# Patient Record
Sex: Female | Born: 1940 | Race: White | Hispanic: No | Smoking: Never smoker
Health system: Southern US, Community
[De-identification: ages and names within clinical notes are randomized; demographics above are authoritative.]

## PROBLEM LIST (undated history)

## (undated) DIAGNOSIS — I2510 Atherosclerotic heart disease of native coronary artery without angina pectoris: Secondary | ICD-10-CM

## (undated) DIAGNOSIS — I10 Essential (primary) hypertension: Secondary | ICD-10-CM

## (undated) DIAGNOSIS — I639 Cerebral infarction, unspecified: Secondary | ICD-10-CM

## (undated) DIAGNOSIS — E119 Type 2 diabetes mellitus without complications: Secondary | ICD-10-CM

## (undated) DIAGNOSIS — I38 Endocarditis, valve unspecified: Secondary | ICD-10-CM

## (undated) DIAGNOSIS — I509 Heart failure, unspecified: Secondary | ICD-10-CM

## (undated) DIAGNOSIS — Z950 Presence of cardiac pacemaker: Secondary | ICD-10-CM

## (undated) HISTORY — PX: PACEMAKER INSERTION: SHX728

## (undated) HISTORY — PX: ABDOMINAL HYSTERECTOMY: SHX81

## (undated) HISTORY — PX: OTHER SURGICAL HISTORY: SHX169

---

## 2015-01-27 ENCOUNTER — Inpatient Hospital Stay (HOSPITAL_BASED_OUTPATIENT_CLINIC_OR_DEPARTMENT_OTHER)
Admission: EM | Admit: 2015-01-27 | Discharge: 2015-01-31 | DRG: 378 | Disposition: A | Discharge status: Home / Self Care | Payer: Medicare Other | Attending: Internal Medicine | Admitting: Internal Medicine

## 2015-01-27 ENCOUNTER — Encounter (HOSPITAL_BASED_OUTPATIENT_CLINIC_OR_DEPARTMENT_OTHER): Payer: Self-pay | Admitting: *Deleted

## 2015-01-27 DIAGNOSIS — K449 Diaphragmatic hernia without obstruction or gangrene: Secondary | ICD-10-CM | POA: Diagnosis present

## 2015-01-27 DIAGNOSIS — N179 Acute kidney failure, unspecified: Secondary | ICD-10-CM | POA: Diagnosis present

## 2015-01-27 DIAGNOSIS — K254 Chronic or unspecified gastric ulcer with hemorrhage: Secondary | ICD-10-CM | POA: Diagnosis present

## 2015-01-27 DIAGNOSIS — K259 Gastric ulcer, unspecified as acute or chronic, without hemorrhage or perforation: Secondary | ICD-10-CM | POA: Diagnosis present

## 2015-01-27 DIAGNOSIS — R000 Tachycardia, unspecified: Secondary | ICD-10-CM | POA: Diagnosis not present

## 2015-01-27 DIAGNOSIS — K219 Gastro-esophageal reflux disease without esophagitis: Secondary | ICD-10-CM | POA: Diagnosis present

## 2015-01-27 DIAGNOSIS — E1122 Type 2 diabetes mellitus with diabetic chronic kidney disease: Secondary | ICD-10-CM | POA: Diagnosis present

## 2015-01-27 DIAGNOSIS — Z6837 Body mass index (BMI) 37.0-37.9, adult: Secondary | ICD-10-CM | POA: Diagnosis not present

## 2015-01-27 DIAGNOSIS — M069 Rheumatoid arthritis, unspecified: Secondary | ICD-10-CM | POA: Diagnosis present

## 2015-01-27 DIAGNOSIS — I252 Old myocardial infarction: Secondary | ICD-10-CM

## 2015-01-27 DIAGNOSIS — D62 Acute posthemorrhagic anemia: Secondary | ICD-10-CM | POA: Diagnosis present

## 2015-01-27 DIAGNOSIS — Z7982 Long term (current) use of aspirin: Secondary | ICD-10-CM | POA: Diagnosis not present

## 2015-01-27 DIAGNOSIS — E860 Dehydration: Secondary | ICD-10-CM | POA: Diagnosis present

## 2015-01-27 DIAGNOSIS — E785 Hyperlipidemia, unspecified: Secondary | ICD-10-CM | POA: Diagnosis present

## 2015-01-27 DIAGNOSIS — I2510 Atherosclerotic heart disease of native coronary artery without angina pectoris: Secondary | ICD-10-CM | POA: Diagnosis present

## 2015-01-27 DIAGNOSIS — I5030 Unspecified diastolic (congestive) heart failure: Secondary | ICD-10-CM | POA: Diagnosis present

## 2015-01-27 DIAGNOSIS — Z950 Presence of cardiac pacemaker: Secondary | ICD-10-CM | POA: Diagnosis not present

## 2015-01-27 DIAGNOSIS — I10 Essential (primary) hypertension: Secondary | ICD-10-CM | POA: Diagnosis not present

## 2015-01-27 DIAGNOSIS — I482 Chronic atrial fibrillation: Secondary | ICD-10-CM | POA: Diagnosis present

## 2015-01-27 DIAGNOSIS — Z8711 Personal history of peptic ulcer disease: Secondary | ICD-10-CM

## 2015-01-27 DIAGNOSIS — Z79899 Other long term (current) drug therapy: Secondary | ICD-10-CM

## 2015-01-27 DIAGNOSIS — I130 Hypertensive heart and chronic kidney disease with heart failure and stage 1 through stage 4 chronic kidney disease, or unspecified chronic kidney disease: Secondary | ICD-10-CM | POA: Diagnosis present

## 2015-01-27 DIAGNOSIS — Z955 Presence of coronary angioplasty implant and graft: Secondary | ICD-10-CM | POA: Diagnosis not present

## 2015-01-27 DIAGNOSIS — K921 Melena: Secondary | ICD-10-CM | POA: Diagnosis present

## 2015-01-27 DIAGNOSIS — I69351 Hemiplegia and hemiparesis following cerebral infarction affecting right dominant side: Secondary | ICD-10-CM | POA: Diagnosis not present

## 2015-01-27 DIAGNOSIS — K625 Hemorrhage of anus and rectum: Secondary | ICD-10-CM | POA: Insufficient documentation

## 2015-01-27 DIAGNOSIS — Z7902 Long term (current) use of antithrombotics/antiplatelets: Secondary | ICD-10-CM

## 2015-01-27 DIAGNOSIS — Z90710 Acquired absence of both cervix and uterus: Secondary | ICD-10-CM

## 2015-01-27 DIAGNOSIS — K922 Gastrointestinal hemorrhage, unspecified: Secondary | ICD-10-CM | POA: Diagnosis not present

## 2015-01-27 DIAGNOSIS — M7989 Other specified soft tissue disorders: Secondary | ICD-10-CM | POA: Diagnosis present

## 2015-01-27 DIAGNOSIS — N183 Chronic kidney disease, stage 3 (moderate): Secondary | ICD-10-CM | POA: Diagnosis present

## 2015-01-27 HISTORY — DX: Presence of cardiac pacemaker: Z95.0

## 2015-01-27 HISTORY — DX: Endocarditis, valve unspecified: I38

## 2015-01-27 HISTORY — DX: Atherosclerotic heart disease of native coronary artery without angina pectoris: I25.10

## 2015-01-27 HISTORY — DX: Type 2 diabetes mellitus without complications: E11.9

## 2015-01-27 HISTORY — DX: Cerebral infarction, unspecified: I63.9

## 2015-01-27 HISTORY — DX: Essential (primary) hypertension: I10

## 2015-01-27 LAB — CBC WITH DIFFERENTIAL/PLATELET
BASOS PCT: 0 %
Basophils Absolute: 0 10*3/uL (ref 0.0–0.1)
EOS ABS: 0.1 10*3/uL (ref 0.0–0.7)
EOS PCT: 1 %
HCT: 36.4 % (ref 36.0–46.0)
Hemoglobin: 11.7 g/dL — ABNORMAL LOW (ref 12.0–15.0)
LYMPHS ABS: 0.6 10*3/uL — AB (ref 0.7–4.0)
Lymphocytes Relative: 7 %
MCH: 32.5 pg (ref 26.0–34.0)
MCHC: 32.1 g/dL (ref 30.0–36.0)
MCV: 101.1 fL — ABNORMAL HIGH (ref 78.0–100.0)
MONO ABS: 0.9 10*3/uL (ref 0.1–1.0)
MONOS PCT: 9 %
Neutro Abs: 8.2 10*3/uL — ABNORMAL HIGH (ref 1.7–7.7)
Neutrophils Relative %: 83 %
PLATELETS: 255 10*3/uL (ref 150–400)
RBC: 3.6 MIL/uL — ABNORMAL LOW (ref 3.87–5.11)
RDW: 13.6 % (ref 11.5–15.5)
WBC: 9.8 10*3/uL (ref 4.0–10.5)

## 2015-01-27 LAB — BASIC METABOLIC PANEL
Anion gap: 8 (ref 5–15)
BUN: 25 mg/dL — AB (ref 6–20)
CALCIUM: 9.4 mg/dL (ref 8.9–10.3)
CHLORIDE: 108 mmol/L (ref 101–111)
CO2: 22 mmol/L (ref 22–32)
CREATININE: 1.17 mg/dL — AB (ref 0.44–1.00)
GFR calc Af Amer: 52 mL/min — ABNORMAL LOW (ref >60)
GFR calc non Af Amer: 45 mL/min — ABNORMAL LOW (ref >60)
Glucose, Bld: 140 mg/dL — ABNORMAL HIGH (ref 65–99)
Potassium: 4.3 mmol/L (ref 3.5–5.1)
SODIUM: 138 mmol/L (ref 135–145)

## 2015-01-27 LAB — HEPATIC FUNCTION PANEL
ALT: 13 U/L — ABNORMAL LOW (ref 14–54)
AST: 18 U/L (ref 15–41)
Albumin: 3.8 g/dL (ref 3.5–5.0)
Alkaline Phosphatase: 75 U/L (ref 38–126)
BILIRUBIN DIRECT: 0.3 mg/dL (ref 0.1–0.5)
BILIRUBIN INDIRECT: 0.5 mg/dL (ref 0.3–0.9)
BILIRUBIN TOTAL: 0.8 mg/dL (ref 0.3–1.2)
Total Protein: 7.1 g/dL (ref 6.5–8.1)

## 2015-01-27 LAB — PROTIME-INR
INR: 1.1 (ref 0.00–1.49)
PROTHROMBIN TIME: 14.4 s (ref 11.6–15.2)

## 2015-01-27 LAB — I-STAT CG4 LACTIC ACID, ED: Lactic Acid, Venous: 0.76 mmol/L (ref 0.5–2.0)

## 2015-01-27 MED ORDER — ROSUVASTATIN CALCIUM 20 MG PO TABS
20.0000 mg | ORAL_TABLET | Freq: Every day | ORAL | Status: DC
  Administered 2015-01-28 – 2015-01-31 (×4): 20 mg via ORAL
  Filled 2015-01-27: qty 1
  Filled 2015-01-27: qty 2
  Filled 2015-01-27: qty 1
  Filled 2015-01-27: qty 2

## 2015-01-27 MED ORDER — SODIUM CHLORIDE 0.9 % IV SOLN
80.0000 mg | Freq: Once | INTRAVENOUS | Status: DC
  Filled 2015-01-27: qty 80

## 2015-01-27 MED ORDER — PANTOPRAZOLE SODIUM 40 MG IV SOLR: 40.0000 mg | Freq: Two times a day (BID) | INTRAVENOUS | Status: DC

## 2015-01-27 MED ORDER — SODIUM CHLORIDE 0.9 % IV SOLN
8.0000 mg/h | INTRAVENOUS | Status: DC
  Administered 2015-01-28 – 2015-01-29 (×4): 8 mg/h via INTRAVENOUS
  Filled 2015-01-27 (×12): qty 80

## 2015-01-27 MED ORDER — PANTOPRAZOLE SODIUM 40 MG IV SOLR
INTRAVENOUS | Status: AC
  Administered 2015-01-27: 80 mg
  Filled 2015-01-27: qty 80

## 2015-01-27 MED ORDER — MOMETASONE FURO-FORMOTEROL FUM 100-5 MCG/ACT IN AERO
2.0000 | INHALATION_SPRAY | Freq: Two times a day (BID) | RESPIRATORY_TRACT | Status: DC
  Administered 2015-01-28 – 2015-01-31 (×5): 2 via RESPIRATORY_TRACT
  Filled 2015-01-27 (×2): qty 8.8

## 2015-01-27 MED ORDER — SODIUM CHLORIDE 0.9 % IV SOLN: 80.0000 mg | Freq: Once | INTRAVENOUS | Status: DC

## 2015-01-27 MED ORDER — SODIUM CHLORIDE 0.9 % IV SOLN
INTRAVENOUS | Status: DC
Start: 2015-01-28 — End: 2015-01-30
  Administered 2015-01-28: 11:00:00 via INTRAVENOUS
  Administered 2015-01-28: 1000 mL via INTRAVENOUS
  Administered 2015-01-29 – 2015-01-30 (×3): via INTRAVENOUS

## 2015-01-27 NOTE — ED Notes (Signed)
Dark red rectal bleeding since this afternoon. She is on blood thinners. Abdominal cramps this afternoon.

## 2015-01-27 NOTE — Plan of Care (Signed)
Was called by Dr. Criss Alvine for admitting patient name Kristy Morris, 74 year old female with history of CAD status post stenting on Plavix presenting with frank rectal bleeding. Patient had at least 2 episodes in the ER as per ER physician. Patient is hemodynamically stable and will be admitted for further management of rectal bleeding.  Midge Minium.

## 2015-01-27 NOTE — ED Notes (Signed)
C/o rectal bleeding onset this pm, states not bright red and not real dark w some clots, lower abd cramping earlier this pm,  Denies pain at present

## 2015-01-27 NOTE — ED Notes (Signed)
MD at bedside. 

## 2015-01-27 NOTE — ED Provider Notes (Signed)
CSN: [id number]     Arrival date & time 01/27/15  1940 History   By signing my name below, I, Kristy Morris, attest that this documentation has been prepared under the direction and in the presence of Pricilla Loveless, MD. Electronically Signed: Bethel Morris, ED Scribe. 01/27/2015. 8:34 PM    Chief Complaint  Patient presents with  . Rectal Bleeding    The history is provided by the patient. No language interpreter was used.   Kristy Morris is a 74 y.o. female with history of DM, HTN, stroke, and CAD on Plavix  who presents to the Emergency Department complaining of hematochezia with onset this afternoon. Pt reports 3-4 episodes of "odd looking" rectal bleeding with clots.  Associated symptoms include intermittent abdominal cramping 1 hour prior to the onset of bleeding (pt states that she is pain free at present) and fatigue for the past week. Pt denies rectal pain. She states that she has had bleeding in the past and at that time she was seen at [facility]. Pt denies history of hemorrhoids.    Past Medical History  Diagnosis Date  . Leaky heart valve   . Pacemaker   . Hypertension   . Diabetes mellitus without complication (HCC)   . Stroke (HCC)   . Coronary artery disease    Past Surgical History  Procedure Laterality Date  . Abdominal hysterectomy    . Pacemaker insertion    . Cardiac stents     No family history on file. Social History  Substance Use Topics  . Smoking status: Never Smoker   . Smokeless tobacco: None  . Alcohol Use: Yes     Comment: weekly   OB History    No data available     Review of Systems  Constitutional: Positive for fever.  Gastrointestinal: Positive for abdominal pain, blood in stool and hematochezia. Negative for rectal pain.  All other systems reviewed and are negative.   Allergies  Review of patient's allergies indicates no known allergies.  Home Medications   Prior to Admission medications   Medication Sig Start  Date End Date Taking? Authorizing Provider  ALPRAZolam Prudy Feeler) 0.5 MG tablet Take 0.5 mg by mouth 2 (two) times daily as needed for anxiety.   Yes Historical Provider, MD  aspirin 81 MG tablet Take 81 mg by mouth daily.   Yes Historical Provider, MD  bisacodyl (DULCOLAX) 10 MG suppository Take 10 mg by mouth daily as needed for moderate constipation.   Yes Historical Provider, MD  carvedilol (COREG CR) 80 MG 24 hr capsule Take 80 mg by mouth daily.   Yes Historical Provider, MD  clopidogrel (PLAVIX) 75 MG tablet Take 75 mg by mouth daily.   Yes Historical Provider, MD  Fluticasone-Salmeterol (ADVAIR) 250-50 MCG/DOSE AEPB Inhale 1 puff into the lungs 2 (two) times daily.   Yes Historical Provider, MD  losartan (COZAAR) 100 MG tablet Take 100 mg by mouth daily.   Yes Historical Provider, MD  nitroGLYCERIN (NITROSTAT) 0.4 MG SL tablet Place 0.4 mg under the tongue every 5 (five) minutes as needed for chest pain.   Yes Historical Provider, MD  pantoprazole (PROTONIX) 40 MG tablet Take 40 mg by mouth 2 (two) times daily.   Yes Historical Provider, MD  rosuvastatin (CRESTOR) 20 MG tablet Take 20 mg by mouth daily.   Yes Historical Provider, MD  torsemide (DEMADEX) 5 MG tablet Take 5 mg by mouth 2 (two) times daily.   Yes Historical Provider, MD  BP 159/94 mmHg  Pulse 95  Temp(Src) 97.5 F (36.4 C) (Oral)  Resp 18  Ht 5' (1.524 m)  Wt 191 lb (86.637 kg)  BMI 37.30 kg/m2  SpO2 98% Physical Exam  Constitutional: She is oriented to person, place, and time. She appears well-developed and well-nourished.  HENT:  Head: Normocephalic and atraumatic.  Right Ear: External ear normal.  Left Ear: External ear normal.  Nose: Nose normal.  Eyes: Right eye exhibits no discharge. Left eye exhibits no discharge.  Cardiovascular: Normal rate, regular rhythm and normal heart sounds.   Pulmonary/Chest: Effort normal and breath sounds normal.  Abdominal: Soft. There is no tenderness.  Neurological: She is  alert and oriented to person, place, and time.  Skin: Skin is warm and dry.  Nursing note and vitals reviewed.   ED Course  Procedures (including critical care time) DIAGNOSTIC STUDIES: Oxygen Saturation is 98% on RA,  normal by my interpretation.    COORDINATION OF CARE: 8:32 PM Discussed treatment plan which includes lab work with pt at bedside and pt agreed to plan.  Labs Review Labs Reviewed  CBC WITH DIFFERENTIAL/PLATELET - Abnormal; Notable for the following:    RBC 3.60 (*)    Hemoglobin 11.7 (*)    MCV 101.1 (*)    Neutro Abs 8.2 (*)    Lymphs Abs 0.6 (*)    All other components within normal limits  BASIC METABOLIC PANEL - Abnormal; Notable for the following:    Glucose, Bld 140 (*)    BUN 25 (*)    Creatinine, Ser 1.17 (*)    GFR calc non Af Amer 45 (*)    GFR calc Af Amer 52 (*)    All other components within normal limits  HEPATIC FUNCTION PANEL - Abnormal; Notable for the following:    ALT 13 (*)    All other components within normal limits  PROTIME-INR  I-STAT CG4 LACTIC ACID, ED    Imaging Review No results found. I have personally reviewed and evaluated these lab results as part of my medical decision-making.   EKG Interpretation None      MDM   Final diagnoses:  Rectal bleeding    Patient with acute rectal bleeding. Patient's abdominal exam is benign and she has not had abdominal pain for several hours. She did have brief cramping right before all the bleeding started but none since. Given no continued pain I have low suspicion for mesenteric ischemia or ischemic colitis as the cause of her bleeding. She is higher risk given her Plavix. Her hemoglobin is 11, I am unable to find old records to compare to. She is stable at this time it has had multiple bloody bowel movements in the ED. She will need close evaluation and serial hemoglobins. Discussed with the hospitalist at [facility], Dr. Toniann Fail, who will accept patient in admission to the  stepdown unit.  I personally performed the services described in this documentation, which was scribed in my presence. The recorded information has been reviewed and is accurate.    Pricilla Loveless, MD 01/27/15 [phone]

## 2015-01-27 NOTE — H&P (Addendum)
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 She went to ED. At that time, she did not have any pain, but continued to have active GI bleeding. The bleeding was dark in color with clots. It was not melena or bridght red blood. She had intermittent abdominal cramping associated with it. The cramping was in the hypogastric region. The bleeding occurred after the cramping stopped. Her last colonoscopy was performed 2 years ago. It was "okay" and was done at [facility]. She was never diagnosed with cancer. Her brother had lung cancer and doe snot have a family history of colon cancer.  Patient was doing well today until 2 PM when she found to have bleeding in the toilet bowel.  Last bleeding episode was 3 years ago. It was found that she had a stomach ulcer. The first bleeding occurred 6-7 years ago when her PUD was diagnosed the first time. The second episode occurred and she was admitted to [facility]. She had pacemaker placed after that time and was placed on plavix instead of coumadin. She was on warfarin because of atrial fibrillation. The PPM was placed 3 years ago. She recently saw her cardiologist on Friday and had echocardiogram done to check a leaky valve. Her last stent was placed 6-7 years ago. She had 3 stents at that time. She denied having history of hemorrhoids.  She was seen by Dr. Criss Alvine in the ED and was sent for admission.  Patient has not been able to  walk well since her stroke in 05/2014. She is now using walker. She does not drive. Her daughter helps her getting to places.   Assessment and Plan: Active Problems:   GI bleed   Acute blood loss anemia   Essential hypertension   Sinus tachycardia (HCC)   Leg swelling   Morbid obesity (HCC)  Gastrointestinal bleeding Ordered iron studies, h pylori testing, CBC q6h, protonix IV drip ordered after bolus Need GI consult for endoscopy/colonoscopy in AM Wide bore IV lines. If patient decompensates tonight, she will need emergent GI consult  Azotemia Secondary to GI bleeding Consider IVF if worsening renal function. Will monitor for now  Hypertension Judicious with BP meds, will try not to reduce blood pressure too much Hydralazine prn   Sinus tachycardia - possibly due to bleeding Keep on telemetry  Rheumatoid arthritis - on tylenol only  Morbid obesity Counseled for diet  Hypertension Holding BP meds  History of CVA High dose statin, holding aspirin and plavix due to risk of worsening bleeding   Radiological Exams on Admission: No results found.   Code Status: Full Family Communication: no family at bedside Disposition Plan: Admit for further evaluation    Timoteo Expose   Review of Systems:  Constitutional: Negative for fever, chills and malaise/fatigue. Negative for diaphoresis.  HENT: Negative for hearing loss, ear pain, nosebleeds, congestion, sore throat, neck pain, tinnitus and ear discharge.   Eyes: Negative for blurred vision, double vision, photophobia, pain, discharge and redness.  Respiratory: Negative for cough, hemoptysis, sputum production, shortness of breath,  wheezing and stridor.   Cardiovascular: Negative for chest pain, palpitations, orthopnea, claudication positive for occasional leg swelling.  since her knee surgery Gastrointestinal: Negative for nausea, vomiting. Negative for heartburn, constipation, positive for blood in stool but no melena.  not taking iron pills Genitourinary: Negative for dysuria, urgency, frequency, hematuria and flank pain.  Musculoskeletal: Negative for myalgias, back pain, positive for right hip/knee and hands due to rheumatoid arthritis  Skin: Negative for itching and rash.  Neurological: Negative for dizziness and weakness. Negative for tingling, tremors, sensory change, speech change, focal weakness, loss of consciousness and headaches.  Endo/Heme/Allergies: Negative for environmental allergies and polydipsia. Does not bruise/bleed easily.  Psychiatric/Behavioral: Negative for suicidal ideas. The patient is not nervous/anxious.      Past Medical History  Diagnosis Date  . Leaky heart valve   . Pacemaker   . Hypertension   . Diabetes mellitus without complication (HCC)   . Stroke (HCC)   . Coronary artery disease     Past Surgical History  Procedure Laterality Date  . Abdominal hysterectomy    . Pacemaker insertion    . Cardiac stents      Social History:  reports that she has never smoked. She does not have any smokeless tobacco history on file. She reports that she drinks alcohol. She reports that she does not use illicit drugs.  No Known Allergies  No family history on file.  Prior to Admission medications   Medication Sig Start Date End Date Taking? Authorizing Provider  ALPRAZolam Prudy Feeler) 0.5 MG tablet Take 0.5 mg by mouth 2 (two) times daily as needed for anxiety.   Yes Historical Provider, MD  aspirin 81 MG tablet Take 81 mg by mouth daily.   Yes Historical Provider, MD  bisacodyl (DULCOLAX) 10 MG suppository Take 10 mg by mouth daily as needed for moderate constipation.   Yes Historical Provider, MD  carvedilol (COREG CR) 80 MG 24 hr capsule Take 80 mg by mouth daily.   Yes Historical Provider, MD  clopidogrel (PLAVIX) 75 MG tablet Take 75 mg by mouth daily.   Yes Historical Provider, MD  Fluticasone-Salmeterol (ADVAIR) 250-50 MCG/DOSE AEPB Inhale 1 puff into the lungs 2 (two) times  daily.   Yes Historical Provider, MD  losartan (COZAAR) 100 MG tablet Take 100 mg by mouth daily.   Yes Historical Provider, MD  nitroGLYCERIN (NITROSTAT) 0.4 MG SL tablet Place 0.4 mg under the tongue every 5 (five) minutes as needed for chest pain.   Yes Historical Provider, MD  pantoprazole (PROTONIX) 40 MG tablet Take 40 mg by mouth 2 (two) times daily.   Yes Historical Provider, MD  rosuvastatin (CRESTOR) 20 MG tablet Take 20 mg by mouth daily.   Yes Historical Provider, MD  torsemide (DEMADEX) 5 MG tablet Take 5 mg by mouth 2 (two) times daily.   Yes Historical Provider, MD    Physical Exam: Filed Vitals:   01/27/15 2106 01/27/15 2130 01/27/15 2200 01/27/15 2330  BP: 169/96 153/85 135/70 164/112  Pulse: 94 80  120  Temp:      TempSrc:      Resp: 24 25 17 25   Height:    5' (1.524 m)  Weight:      SpO2: 96% 95%  100%    Physical Exam  Constitutional: Appears well-developed and well-nourished. Obese female in no acute disterss HENT: EOMs intact. No conjunctival pallor Eyes: Conjunctivae not pallor  Neck: No JVD while sitting   CVS: tachycardiac, regular, widely split S2,  no murmurs   Pulmonary: Effort and breath sounds normal, no stridor, rhonchi, wheezes, rales.  Abdominal: Soft.  Mild hypogastric tenderness, hyperactive bowel sounds no rebound or guarding.  Musculoskeletal: Normal range of motion. No edema and no tenderness.  Lymphadenopathy: No lymphadenopathy noted, cervical, inguinal. Neuro: Alert. Normal reflexes, muscle tone coordination. No cranial nerve deficit. Skin: Skin is warm and dry. No rash noted. Not diaphoretic. No erythema. No pallor.  Psychiatric: Normal mood and affect. Behavior, judgment, thought content normal.  Extremities:  Trace bilateral leg swelling.   Labs on Admission:  Basic Metabolic Panel:  Recent Labs Lab 01/27/15 2030  NA 138  K 4.3  CL 108  CO2 22  GLUCOSE 140*  BUN 25*  CREATININE 1.17*  CALCIUM 9.4   Liver Function  Tests:  Recent Labs Lab 01/27/15 2030  AST 18  ALT 13*  ALKPHOS 75  BILITOT 0.8  PROT 7.1  ALBUMIN 3.8   No results for input(s): LIPASE, AMYLASE in the last 168 hours. No results for input(s): AMMONIA in the last 168 hours. CBC:  Recent Labs Lab 01/27/15 2030  WBC 9.8  NEUTROABS 8.2*  HGB 11.7*  HCT 36.4  MCV 101.1*  PLT 255   Cardiac Enzymes: No results for input(s): CKTOTAL, CKMB, CKMBINDEX, TROPONINI in the last 168 hours. BNP: Invalid input(s): POCBNP CBG: No results for input(s): GLUCAP in the last 168 hours.  EKG: pending   If 7PM-7AM, please contact night-coverage [email or web address] Password [facility] 01/28/2015, 12:18 AM

## 2015-01-28 ENCOUNTER — Inpatient Hospital Stay (HOSPITAL_COMMUNITY): Payer: Medicare Other | Admitting: Certified Registered Nurse Anesthetist

## 2015-01-28 ENCOUNTER — Encounter (HOSPITAL_COMMUNITY): Payer: Self-pay | Admitting: *Deleted

## 2015-01-28 ENCOUNTER — Encounter (HOSPITAL_COMMUNITY)
Admission: EM | Disposition: A | Discharge status: Home / Self Care | Payer: Self-pay | Source: Home / Self Care | Attending: Internal Medicine

## 2015-01-28 DIAGNOSIS — I10 Essential (primary) hypertension: Secondary | ICD-10-CM | POA: Diagnosis present

## 2015-01-28 DIAGNOSIS — D62 Acute posthemorrhagic anemia: Secondary | ICD-10-CM | POA: Diagnosis present

## 2015-01-28 DIAGNOSIS — R000 Tachycardia, unspecified: Secondary | ICD-10-CM | POA: Diagnosis present

## 2015-01-28 DIAGNOSIS — M7989 Other specified soft tissue disorders: Secondary | ICD-10-CM | POA: Diagnosis present

## 2015-01-28 HISTORY — PX: ESOPHAGOGASTRODUODENOSCOPY: SHX5428

## 2015-01-28 LAB — CBC
HCT: 31.6 % — ABNORMAL LOW (ref 36.0–46.0)
HCT: 35.7 % — ABNORMAL LOW (ref 36.0–46.0)
HEMATOCRIT: 30.4 % — AB (ref 36.0–46.0)
HEMOGLOBIN: 10.3 g/dL — AB (ref 12.0–15.0)
HEMOGLOBIN: 9.8 g/dL — AB (ref 12.0–15.0)
Hemoglobin: 11.4 g/dL — ABNORMAL LOW (ref 12.0–15.0)
MCH: 32.1 pg (ref 26.0–34.0)
MCH: 32.2 pg (ref 26.0–34.0)
MCH: 32.8 pg (ref 26.0–34.0)
MCHC: 31.9 g/dL (ref 30.0–36.0)
MCHC: 32.2 g/dL (ref 30.0–36.0)
MCHC: 32.6 g/dL (ref 30.0–36.0)
MCV: 100.6 fL — ABNORMAL HIGH (ref 78.0–100.0)
MCV: 100.8 fL — AB (ref 78.0–100.0)
MCV: 99.7 fL (ref 78.0–100.0)
PLATELETS: 232 10*3/uL (ref 150–400)
Platelets: 216 10*3/uL (ref 150–400)
Platelets: 237 10*3/uL (ref 150–400)
RBC: 3.05 MIL/uL — ABNORMAL LOW (ref 3.87–5.11)
RBC: 3.14 MIL/uL — ABNORMAL LOW (ref 3.87–5.11)
RBC: 3.54 MIL/uL — AB (ref 3.87–5.11)
RDW: 13.7 % (ref 11.5–15.5)
RDW: 13.9 % (ref 11.5–15.5)
RDW: 14 % (ref 11.5–15.5)
WBC: 7.5 10*3/uL (ref 4.0–10.5)
WBC: 7.9 10*3/uL (ref 4.0–10.5)
WBC: 9.5 10*3/uL (ref 4.0–10.5)

## 2015-01-28 LAB — IRON AND TIBC
IRON: 26 ug/dL — AB (ref 28–170)
SATURATION RATIOS: 9 % — AB (ref 10.4–31.8)
TIBC: 283 ug/dL (ref 250–450)
UIBC: 257 ug/dL

## 2015-01-28 LAB — COMPREHENSIVE METABOLIC PANEL
ALK PHOS: 59 U/L (ref 38–126)
ALT: 10 U/L — AB (ref 14–54)
AST: 16 U/L (ref 15–41)
Albumin: 3 g/dL — ABNORMAL LOW (ref 3.5–5.0)
Anion gap: 8 (ref 5–15)
BILIRUBIN TOTAL: 0.9 mg/dL (ref 0.3–1.2)
BUN: 20 mg/dL (ref 6–20)
CALCIUM: 9.2 mg/dL (ref 8.9–10.3)
CHLORIDE: 110 mmol/L (ref 101–111)
CO2: 24 mmol/L (ref 22–32)
CREATININE: 1.14 mg/dL — AB (ref 0.44–1.00)
GFR, EST AFRICAN AMERICAN: 54 mL/min — AB (ref >60)
GFR, EST NON AFRICAN AMERICAN: 47 mL/min — AB (ref >60)
Glucose, Bld: 146 mg/dL — ABNORMAL HIGH (ref 65–99)
Potassium: 4.7 mmol/L (ref 3.5–5.1)
Sodium: 142 mmol/L (ref 135–145)
Total Protein: 5.8 g/dL — ABNORMAL LOW (ref 6.5–8.1)

## 2015-01-28 LAB — FERRITIN: Ferritin: 53 ng/mL (ref 11–307)

## 2015-01-28 LAB — MRSA PCR SCREENING: MRSA BY PCR: NEGATIVE

## 2015-01-28 LAB — GLUCOSE, CAPILLARY: Glucose-Capillary: 121 mg/dL — ABNORMAL HIGH (ref 65–99)

## 2015-01-28 SURGERY — EGD (ESOPHAGOGASTRODUODENOSCOPY)
Anesthesia: Monitor Anesthesia Care

## 2015-01-28 MED ORDER — PROPOFOL 10 MG/ML IV BOLUS
INTRAVENOUS | Status: DC | PRN
  Administered 2015-01-28: 10 mg via INTRAVENOUS

## 2015-01-28 MED ORDER — LACTATED RINGERS IV SOLN
INTRAVENOUS | Status: DC
  Administered 2015-01-28: 1000 mL via INTRAVENOUS
  Administered 2015-01-28: 14:00:00 via INTRAVENOUS

## 2015-01-28 MED ORDER — TORSEMIDE 20 MG PO TABS
20.0000 mg | ORAL_TABLET | Freq: Every day | ORAL | Status: DC
  Administered 2015-01-28: 20 mg via ORAL
  Filled 2015-01-28: qty 1

## 2015-01-28 MED ORDER — HYDRALAZINE HCL 20 MG/ML IJ SOLN
10.0000 mg | Freq: Four times a day (QID) | INTRAMUSCULAR | Status: DC | PRN
  Filled 2015-01-28: qty 1

## 2015-01-28 MED ORDER — PROPOFOL 500 MG/50ML IV EMUL
INTRAVENOUS | Status: DC | PRN
  Administered 2015-01-28: 100 ug/kg/min via INTRAVENOUS

## 2015-01-28 MED ORDER — HYDROCERIN EX CREA
TOPICAL_CREAM | CUTANEOUS | Status: DC | PRN
  Administered 2015-01-28: 1 via TOPICAL
  Filled 2015-01-28: qty 113

## 2015-01-28 MED ORDER — SODIUM CHLORIDE 0.9 % IV SOLN: INTRAVENOUS | Status: DC

## 2015-01-28 NOTE — Op Note (Signed)
[facility] [address] [place], [address]   ENDOSCOPY PROCEDURE REPORT  PATIENT: Kristy Morris, Kristy Morris  MR#: [id number] BIRTHDATE: [birth year] , 74  yrs. old GENDER: female ENDOSCOPIST:Anisha Starliper Randa Evens, MD REFERRED BY: [facility]. PCP: Dr. Leonette Most PROCEDURE DATE:  01/28/2015 PROCEDURE:   EGD and biopsy ASA CLASS:    class III INDICATIONS: melanic  stools MEDICATION: propofol 78 mg IV TOPICAL ANESTHETIC:   9  DESCRIPTION OF PROCEDURE:   After the risks and benefits of the procedure were explained, informed consent was obtained.  The Pentax Gastroscope [id number]  endoscope was introduced through the mouth  and advanced to the second portion of the duodenum .  The instrument was slowly withdrawn as the mucosa was fully examined. Estimated blood loss is zero unless otherwise noted in this procedure report. The duodenum was seen well . There was no active bleeding or ulceration in the 2nd duodenum or in the duodenal bulb. The port channel was normal. In the antral area were several shallow linear ulcers the largest of which was biopsied. There was no active bleeding. No coffee ground material or signs of recent bleeding. The remainder the stomach was examined in the forward and retro flex view and was basically normal. There was a relatively large hiatal hernia 4 to 5 cm in length. No varices were seen. The scope was withdrawn in the patient tolerated the procedure well    The scope was then withdrawn from the patient and the procedure completed.  COMPLICATIONS: There were no immediate complications.  ENDOSCOPIC IMPRESSION: 1. Shallow antral ulcers. Probably the source of her recent bleeding. Patient denies the use of NSAIDs. 2. Large hiatal hernia RECOMMENDATIONS: will check path results. May need repeat EGD. Would keep on PPI therapy.   _______________________________ Rosalie DoctorCarman Ching, MD 01/28/2015 2:47 PM     cc: Dr Loyal Jacobson  CPT CODES: ICD CODES:  The ICD and CPT codes recommended by this software are interpretations from the data that the clinical staff has captured with the software.  The verification of the translation of this report to the ICD and CPT codes and modifiers is the sole responsibility of the health care institution and practicing physician where this report was generated.  [employer] will not be held responsible for the validity of the ICD and CPT codes included on this report.  AMA assumes no liability for data contained or not contained herein. CPT is a Publishing rights manager of the [employer].  PATIENT NAME:  Areya, Lemmerman MR#: [id number]

## 2015-01-28 NOTE — Anesthesia Preprocedure Evaluation (Addendum)
Anesthesia Evaluation  Patient identified by MRN, date of birth, ID band Patient awake    Reviewed: Allergy & Precautions, NPO status , Patient's Chart, lab work & pertinent test results, reviewed documented beta blocker date and time   Airway Mallampati: III  TM Distance: >3 FB Neck ROM: Full    Dental  (+) Dental Advisory Given, Upper Dentures   Pulmonary COPD,  COPD inhaler,    breath sounds clear to auscultation + decreased breath sounds      Cardiovascular hypertension, Pt. on medications and Pt. on home beta blockers + CAD, + Past MI and + Cardiac Stents  Normal cardiovascular exam+ pacemaker  Rhythm:Regular Rate:Normal     Neuro/Psych CVA (RLE weakness, numbness), Residual Symptoms negative psych ROS   GI/Hepatic Neg liver ROS, GERD  Medicated,  Endo/Other  diabetes, Well ControlledObesity   Renal/GU negative Renal ROS     Musculoskeletal negative musculoskeletal ROS (+)   Abdominal   Peds  Hematology  (+) Blood dyscrasia, anemia ,   Anesthesia Other Findings Day of surgery medications reviewed with the patient.  Reproductive/Obstetrics                            Anesthesia Physical Anesthesia Plan  ASA: III  Anesthesia Plan: MAC   Post-op Pain Management:    Induction: Intravenous  Airway Management Planned: Nasal Cannula  Additional Equipment:   Intra-op Plan:   Post-operative Plan:   Informed Consent: I have reviewed the patients History and Physical, chart, labs and discussed the procedure including the risks, benefits and alternatives for the proposed anesthesia with the patient or authorized representative who has indicated his/her understanding and acceptance.   Dental advisory given  Plan Discussed with: CRNA and Anesthesiologist  Anesthesia Plan Comments: (Discussed risks/benefits/alternatives to MAC sedation including need for ventilatory support,  hypotension, need for conversion to general anesthesia.  All patient questions answered.  Patient wished to proceed.)        Anesthesia Quick Evaluation

## 2015-01-28 NOTE — Anesthesia Postprocedure Evaluation (Signed)
Anesthesia Post Note  Patient: Kristy Morris  Procedure(s) Performed: Procedure(s) (LRB): ESOPHAGOGASTRODUODENOSCOPY (EGD) (N/A)  Patient location during evaluation: PACU Anesthesia Type: MAC Level of consciousness: awake and alert Pain management: pain level controlled Vital Signs Assessment: post-procedure vital signs reviewed and stable Respiratory status: spontaneous breathing, nonlabored ventilation, respiratory function stable and patient connected to nasal cannula oxygen Cardiovascular status: stable and blood pressure returned to baseline Anesthetic complications: no    Last Vitals:  Filed Vitals:   01/28/15 1450 01/28/15 1500  BP: 127/38 130/35  Pulse: 80 80  Temp:    Resp: 27 27    Last Pain: There were no vitals filed for this visit.               Cecile Hearing

## 2015-01-28 NOTE — Transfer of Care (Signed)
Immediate Anesthesia Transfer of Care Note  Patient: Kristy Morris  Procedure(s) Performed: Procedure(s): ESOPHAGOGASTRODUODENOSCOPY (EGD) (N/A)  Patient Location: PACU  Anesthesia Type:MAC  Level of Consciousness: awake, patient cooperative and responds to stimulation  Airway & Oxygen Therapy: Patient Spontanous Breathing and Patient connected to nasal cannula oxygen  Post-op Assessment: Report given to RN and Post -op Vital signs reviewed and stable  Post vital signs: Reviewed and stable  Last Vitals:  Filed Vitals:   01/28/15 1155 01/28/15 1344  BP: 159/75 169/61  Pulse: 87 79  Temp: 36.3 C 36.3 C  Resp: 29 20    Complications: No apparent anesthesia complications

## 2015-01-28 NOTE — Progress Notes (Signed)
[facility] PROGRESS NOTE   Kristy Morris TIR:[id number] DOB: [birth year] DOA: 01/27/2015 PCP: Sid Falcon, MD  HPI/Subjective: Patient seen with daughter at bedside, has had bloody bowel movement yesterday Slight nausea but no abdominal pain.  Assessment/Plan: Active Problems:   GI bleed   Acute blood loss anemia   Essential hypertension   Sinus tachycardia (HCC)   Leg swelling   Morbid obesity (HCC)   Patient seen earlier today by my colleague Dr. Virgina Organ, this is a no charge note. I have seen and examined the patient and reviewed database.  Gastrointestinal bleeding, history of PUD Ordered iron studies, h pylori testing, CBC q6h, protonix IV drip ordered after bolus History of PUD in 2013 (back then she was on Coumadin). Currently on aspirin and Plavix, continue to hold. Last cardiac stent over 5 years ago. Gastroenterology consulted for further evaluation.  Anemia Acute blood loss anemia secondary to GI bleed, no baseline on records. Patient presented with hemoglobin of 11.7, after initiation of IV fluids hemoglobin dropped to 10.3. Check hemoglobin every 12 hours, transfuse if hemoglobin <8.0.   Acute renal failure Could be secondary to GI bleed versus dehydration. Is on IV fluids, BUN is improving, has a slight acute renal failure likely secondary to dehydration.  Hypertension Judicious with BP meds, will try not to reduce blood pressure too much Hydralazine prn   Sinus tachycardia - possibly due to bleeding Keep on telemetry  Rheumatoid arthritis - on tylenol only  Morbid obesity Counseled for diet, Body mass index is 37.3 kg/(m^2).  Hypertension Holding BP meds  History of CVA High dose statin, holding aspirin and plavix due to risk of worsening bleeding   Code Status: Full Code Family Communication: Plan discussed with the patient. Disposition Plan: Remains inpatient Diet: Diet NPO time specified Except for: Sips with  Meds  Consultants: Gastroenterology   Procedures:  None   Antibiotics:  None    Objective: Filed Vitals:   01/28/15 0541 01/28/15 0753  BP:  158/77  Pulse:  86  Temp: 97.6 F (36.4 C) 97.4 F (36.3 C)  Resp:  22    Intake/Output Summary (Last 24 hours) at 01/28/15 1103 Last data filed at 01/28/15 1000  Gross per 24 hour  Intake 259.58 ml  Output    400 ml  Net -140.42 ml   Filed Weights   01/27/15 1948  Weight: 86.637 kg (191 lb)    Exam: General: Alert and awake, oriented x3, not in any acute distress. HEENT: anicteric sclera, pupils reactive to light and accommodation, EOMI CVS: S1-S2 clear, no murmur rubs or gallops Chest: clear to auscultation bilaterally, no wheezing, rales or rhonchi Abdomen: soft nontender, nondistended, normal bowel sounds, no organomegaly Extremities: no cyanosis, clubbing or edema noted bilaterally Neuro: Cranial nerves II-XII intact, no focal neurological deficits  Data Reviewed: Basic Metabolic Panel:  Recent Labs Lab 01/27/15 2030 01/28/15 0426  NA 138 142  K 4.3 4.7  CL 108 110  CO2 22 24  GLUCOSE 140* 146*  BUN 25* 20  CREATININE 1.17* 1.14*  CALCIUM 9.4 9.2   Liver Function Tests:  Recent Labs Lab 01/27/15 2030 01/28/15 0426  AST 18 16  ALT 13* 10*  ALKPHOS 75 59  BILITOT 0.8 0.9  PROT 7.1 5.8*  ALBUMIN 3.8 3.0*   No results for input(s): LIPASE, AMYLASE in the last 168 hours. No results for input(s): AMMONIA in the last 168 hours. CBC:  Recent Labs Lab 01/27/15 2030 01/28/15 0052 01/28/15 0426  WBC  9.8 9.5 7.9  NEUTROABS 8.2*  --   --   HGB 11.7* 11.4* 10.3*  HCT 36.4 35.7* 31.6*  MCV 101.1* 100.8* 100.6*  PLT 255 232 216   Cardiac Enzymes: No results for input(s): CKTOTAL, CKMB, CKMBINDEX, TROPONINI in the last 168 hours. BNP (last 3 results) No results for input(s): BNP in the last 8760 hours.  ProBNP (last 3 results) No results for input(s): PROBNP in the last 8760  hours.  CBG:  Recent Labs Lab 01/27/15 2329  GLUCAP 121*    Micro Recent Results (from the past 240 hour(s))  MRSA PCR Screening     Status: None   Collection Time: 01/27/15 11:24 PM  Result Value Ref Range Status   MRSA by PCR NEGATIVE NEGATIVE Final    Comment:        The GeneXpert MRSA Assay (FDA approved for NASAL specimens only), is one component of a comprehensive MRSA colonization surveillance program. It is not intended to diagnose MRSA infection nor to guide or monitor treatment for MRSA infections.      Studies: No results found.  Scheduled Meds: . mometasone-formoterol  2 puff Inhalation BID  . pantoprazole (PROTONIX) IV  80 mg Intravenous Once  . [START ON 01/31/2015] pantoprazole (PROTONIX) IV  40 mg Intravenous Q12H  . rosuvastatin  20 mg Oral Daily  . torsemide  20 mg Oral Daily   Continuous Infusions: . sodium chloride 100 mL/hr at 01/28/15 1053  . pantoprozole (PROTONIX) infusion 8 mg/hr (01/28/15 0811)       Time spent: 35 minutes    [facility]  [facility] Pager [phone] If 7PM-7AM, please contact night-coverage at [email or web address], password [facility] 01/28/2015, 11:03 AM  LOS: 1 day

## 2015-01-28 NOTE — Plan of Care (Signed)
Problem: Nutrition: Goal: Adequate nutrition will be maintained Outcome: Not Applicable Date Met:  90/37/95 Patient is NPO at this time

## 2015-01-28 NOTE — H&P (View-Only) (Signed)
Reason for Consult: GI bleed Referring Physician: Hospital team  Kristy Morris is an 74 y.o. female.  HPI: Patient seen and examined and hospital computer chart reviewed and this is her third GI bleeding the last one was 3 [place] years ago at [place] and the other was previously in [place] roughly 7 years ago and supposedly both times was due to stomach ulcers and she is on a stomach pill at home but does not know the name and she is on aspirin and Plavix and has had some minimal midepigastric discomfort but no blood in her bowels until this evening when she had some maroon stools and she has no other GI complaints and does not take any extra aspirin or nonsteroidals and her family history is negative for any GI problems and she's had 3 colonoscopies in her life she says without any polyps and she has no other complaints  Past Medical History  Diagnosis Date  . Leaky heart valve   . Pacemaker   . Hypertension   . Diabetes mellitus without complication ([place])   . Stroke ([place])   . Coronary artery disease     Past Surgical History  Procedure Laterality Date  . Abdominal hysterectomy    . Pacemaker insertion    . Cardiac stents      History reviewed. No pertinent family history.  Social History:  reports that she has never smoked. She does not have any smokeless tobacco history on file. She reports that she drinks alcohol. She reports that she does not use illicit drugs.  Allergies: No Known Allergies  Medications: I have reviewed the patient's current medications.  Results for orders placed or performed during the hospital encounter of 01/27/15 (from the past 48 hour(s))  CBC with Differential     Status: Abnormal   Collection Time: 01/27/15  8:30 PM  Result Value Ref Range   WBC 9.8 4.0 - 10.5 K/uL   RBC 3.60 (L) 3.87 - 5.11 MIL/uL   Hemoglobin 11.7 (L) 12.0 - 15.0 g/dL   HCT 36.4 36.0 - 46.0 %   MCV 101.1 (H) 78.0 - 100.0 fL   MCH 32.5 26.0 - 34.0 pg   MCHC 32.1 30.0  - 36.0 g/dL   RDW 13.6 11.5 - 15.5 %   Platelets 255 150 - 400 K/uL   Neutrophils Relative % 83 %   Neutro Abs 8.2 (H) 1.7 - 7.7 K/uL   Lymphocytes Relative 7 %   Lymphs Abs 0.6 (L) 0.7 - 4.0 K/uL   Monocytes Relative 9 %   Monocytes Absolute 0.9 0.1 - 1.0 K/uL   Eosinophils Relative 1 %   Eosinophils Absolute 0.1 0.0 - 0.7 K/uL   Basophils Relative 0 %   Basophils Absolute 0.0 0.0 - 0.1 K/uL  Basic metabolic panel     Status: Abnormal   Collection Time: 01/27/15  8:30 PM  Result Value Ref Range   Sodium 138 135 - 145 mmol/L   Potassium 4.3 3.5 - 5.1 mmol/L   Chloride 108 101 - 111 mmol/L   CO2 22 22 - 32 mmol/L   Glucose, Bld 140 (H) 65 - 99 mg/dL   BUN 25 (H) 6 - 20 mg/dL   Creatinine, Ser 1.17 (H) 0.44 - 1.00 mg/dL   Calcium 9.4 8.9 - 10.3 mg/dL   GFR calc non Af Amer 45 (L) >60 mL/min   GFR calc Af Amer 52 (L) >60 mL/min    Comment: (NOTE) The eGFR has been calculated  using the CKD EPI equation. This calculation has not been validated in all clinical situations. eGFR's persistently <60 mL/min signify possible Chronic Kidney Disease.    Anion gap 8 5 - 15  Protime-INR     Status: None   Collection Time: 01/27/15  8:30 PM  Result Value Ref Range   Prothrombin Time 14.4 11.6 - 15.2 seconds   INR 1.10 0.00 - 1.49  Hepatic function panel     Status: Abnormal   Collection Time: 01/27/15  8:30 PM  Result Value Ref Range   Total Protein 7.1 6.5 - 8.1 g/dL   Albumin 3.8 3.5 - 5.0 g/dL   AST 18 15 - 41 U/L   ALT 13 (L) 14 - 54 U/L   Alkaline Phosphatase 75 38 - 126 U/L   Total Bilirubin 0.8 0.3 - 1.2 mg/dL   Bilirubin, Direct 0.3 0.1 - 0.5 mg/dL   Indirect Bilirubin 0.5 0.3 - 0.9 mg/dL  I-Stat CG4 Lactic Acid, ED     Status: None   Collection Time: 01/27/15  9:02 PM  Result Value Ref Range   Lactic Acid, Venous 0.76 0.5 - 2.0 mmol/L  MRSA PCR Screening     Status: None   Collection Time: 01/27/15 11:24 PM  Result Value Ref Range   MRSA by PCR NEGATIVE NEGATIVE     Comment:        The GeneXpert MRSA Assay (FDA approved for NASAL specimens only), is one component of a comprehensive MRSA colonization surveillance program. It is not intended to diagnose MRSA infection nor to guide or monitor treatment for MRSA infections.   Glucose, capillary     Status: Abnormal   Collection Time: 01/27/15 11:29 PM  Result Value Ref Range   Glucose-Capillary 121 (H) 65 - 99 mg/dL  CBC     Status: Abnormal   Collection Time: 01/28/15 12:52 AM  Result Value Ref Range   WBC 9.5 4.0 - 10.5 K/uL   RBC 3.54 (L) 3.87 - 5.11 MIL/uL   Hemoglobin 11.4 (L) 12.0 - 15.0 g/dL   HCT 35.7 (L) 36.0 - 46.0 %   MCV 100.8 (H) 78.0 - 100.0 fL   MCH 32.2 26.0 - 34.0 pg   MCHC 31.9 30.0 - 36.0 g/dL   RDW 13.7 11.5 - 15.5 %   Platelets 232 150 - 400 K/uL  Comprehensive metabolic panel     Status: Abnormal   Collection Time: 01/28/15  4:26 AM  Result Value Ref Range   Sodium 142 135 - 145 mmol/L   Potassium 4.7 3.5 - 5.1 mmol/L   Chloride 110 101 - 111 mmol/L   CO2 24 22 - 32 mmol/L   Glucose, Bld 146 (H) 65 - 99 mg/dL   BUN 20 6 - 20 mg/dL   Creatinine, Ser 1.14 (H) 0.44 - 1.00 mg/dL   Calcium 9.2 8.9 - 10.3 mg/dL   Total Protein 5.8 (L) 6.5 - 8.1 g/dL   Albumin 3.0 (L) 3.5 - 5.0 g/dL   AST 16 15 - 41 U/L   ALT 10 (L) 14 - 54 U/L   Alkaline Phosphatase 59 38 - 126 U/L   Total Bilirubin 0.9 0.3 - 1.2 mg/dL   GFR calc non Af Amer 47 (L) >60 mL/min   GFR calc Af Amer 54 (L) >60 mL/min    Comment: (NOTE) The eGFR has been calculated using the CKD EPI equation. This calculation has not been validated in all clinical situations. eGFR's persistently <60 mL/min signify possible  Chronic Kidney Disease.    Anion gap 8 5 - 15  CBC     Status: Abnormal   Collection Time: 01/28/15  4:26 AM  Result Value Ref Range   WBC 7.9 4.0 - 10.5 K/uL   RBC 3.14 (L) 3.87 - 5.11 MIL/uL   Hemoglobin 10.3 (L) 12.0 - 15.0 g/dL   HCT 31.6 (L) 36.0 - 46.0 %   MCV 100.6 (H) 78.0 - 100.0 fL    MCH 32.8 26.0 - 34.0 pg   MCHC 32.6 30.0 - 36.0 g/dL   RDW 14.0 11.5 - 15.5 %   Platelets 216 150 - 400 K/uL  Iron and TIBC     Status: Abnormal   Collection Time: 01/28/15  4:26 AM  Result Value Ref Range   Iron 26 (L) 28 - 170 ug/dL   TIBC 283 250 - 450 ug/dL   Saturation Ratios 9 (L) 10.4 - 31.8 %   UIBC 257 ug/dL  Ferritin     Status: None   Collection Time: 01/28/15  4:26 AM  Result Value Ref Range   Ferritin 53 11 - 307 ng/mL    No results found.  ROS negative except above Blood pressure 159/75, pulse 87, temperature 97.4 F (36.3 C), temperature source Oral, resp. rate 29, height 5' (1.524 m), weight 86.637 kg (191 lb), SpO2 100 %. Physical Exam vital signs stable afebrile no acute distress lungs are clear regular rate and rhythm abdomen is soft nontender except for some very minimal midepigastric discomfort good bowel sounds labs reviewed hemoglobin okay BUN and creatinine okay  Assessment/Plan: GI bleeding questionable etiology in a patient on aspirin and Plavix with history of stomach ulcers Plan: We discussed repeat endoscopy today and will proceed by my partner Dr. Oletta Lamas and in the meantime try to get her previous colonoscopy from [place] with further workup and plans pending endoscopic findings  Soua Lenk E 01/28/2015, 12:25 PM

## 2015-01-28 NOTE — Interval H&P Note (Signed)
History and Physical Interval Note:  01/28/2015 2:18 PM  Kristy Morris  has presented today for surgery, with the diagnosis of GI bleed  The various methods of treatment have been discussed with the patient and family. After consideration of risks, benefits and other options for treatment, the patient has consented to  Procedure(s): ESOPHAGOGASTRODUODENOSCOPY (EGD) (N/A) as a surgical intervention .  The patient's history has been reviewed, patient examined, no change in status, stable for surgery.  I have reviewed the patient's chart and labs.  Questions were answered to the patient's satisfaction.     Matsue Strom JR,Devika Dragovich L

## 2015-01-28 NOTE — Consult Note (Signed)
Reason for Consult: GI bleed Referring Physician: Hospital team  Kristy Morris is an 74 y.o. female.  HPI: Patient seen and examined and hospital computer chart reviewed and this is her third GI bleeding the last one was 3 and half years ago at [place] and the other was previously in [place] roughly 7 years ago and supposedly both times was due to stomach ulcers and she is on a stomach pill at home but does not know the name and she is on aspirin and Plavix and has had some minimal midepigastric discomfort but no blood in her bowels until this evening when she had some maroon stools and she has no other GI complaints and does not take any extra aspirin or nonsteroidals and her family history is negative for any GI problems and she's had 3 colonoscopies in her life she says without any polyps and she has no other complaints  Past Medical History  Diagnosis Date  . Leaky heart valve   . Pacemaker   . Hypertension   . Diabetes mellitus without complication ([place])   . Stroke ([place])   . Coronary artery disease     Past Surgical History  Procedure Laterality Date  . Abdominal hysterectomy    . Pacemaker insertion    . Cardiac stents      History reviewed. No pertinent family history.  Social History:  reports that she has never smoked. She does not have any smokeless tobacco history on file. She reports that she drinks alcohol. She reports that she does not use illicit drugs.  Allergies: No Known Allergies  Medications: I have reviewed the patient's current medications.  Results for orders placed or performed during the hospital encounter of 01/27/15 (from the past 48 hour(s))  CBC with Differential     Status: Abnormal   Collection Time: 01/27/15  8:30 PM  Result Value Ref Range   WBC 9.8 4.0 - 10.5 K/uL   RBC 3.60 (L) 3.87 - 5.11 MIL/uL   Hemoglobin 11.7 (L) 12.0 - 15.0 g/dL   HCT 36.4 36.0 - 46.0 %   MCV 101.1 (H) 78.0 - 100.0 fL   MCH 32.5 26.0 - 34.0 pg   MCHC 32.1 30.0  - 36.0 g/dL   RDW 13.6 11.5 - 15.5 %   Platelets 255 150 - 400 K/uL   Neutrophils Relative % 83 %   Neutro Abs 8.2 (H) 1.7 - 7.7 K/uL   Lymphocytes Relative 7 %   Lymphs Abs 0.6 (L) 0.7 - 4.0 K/uL   Monocytes Relative 9 %   Monocytes Absolute 0.9 0.1 - 1.0 K/uL   Eosinophils Relative 1 %   Eosinophils Absolute 0.1 0.0 - 0.7 K/uL   Basophils Relative 0 %   Basophils Absolute 0.0 0.0 - 0.1 K/uL  Basic metabolic panel     Status: Abnormal   Collection Time: 01/27/15  8:30 PM  Result Value Ref Range   Sodium 138 135 - 145 mmol/L   Potassium 4.3 3.5 - 5.1 mmol/L   Chloride 108 101 - 111 mmol/L   CO2 22 22 - 32 mmol/L   Glucose, Bld 140 (H) 65 - 99 mg/dL   BUN 25 (H) 6 - 20 mg/dL   Creatinine, Ser 1.17 (H) 0.44 - 1.00 mg/dL   Calcium 9.4 8.9 - 10.3 mg/dL   GFR calc non Af Amer 45 (L) >60 mL/min   GFR calc Af Amer 52 (L) >60 mL/min    Comment: (NOTE) The eGFR has been calculated  using the CKD EPI equation. This calculation has not been validated in all clinical situations. eGFR's persistently <60 mL/min signify possible Chronic Kidney Disease.    Anion gap 8 5 - 15  Protime-INR     Status: None   Collection Time: 01/27/15  8:30 PM  Result Value Ref Range   Prothrombin Time 14.4 11.6 - 15.2 seconds   INR 1.10 0.00 - 1.49  Hepatic function panel     Status: Abnormal   Collection Time: 01/27/15  8:30 PM  Result Value Ref Range   Total Protein 7.1 6.5 - 8.1 g/dL   Albumin 3.8 3.5 - 5.0 g/dL   AST 18 15 - 41 U/L   ALT 13 (L) 14 - 54 U/L   Alkaline Phosphatase 75 38 - 126 U/L   Total Bilirubin 0.8 0.3 - 1.2 mg/dL   Bilirubin, Direct 0.3 0.1 - 0.5 mg/dL   Indirect Bilirubin 0.5 0.3 - 0.9 mg/dL  I-Stat CG4 Lactic Acid, ED     Status: None   Collection Time: 01/27/15  9:02 PM  Result Value Ref Range   Lactic Acid, Venous 0.76 0.5 - 2.0 mmol/L  MRSA PCR Screening     Status: None   Collection Time: 01/27/15 11:24 PM  Result Value Ref Range   MRSA by PCR NEGATIVE NEGATIVE     Comment:        The GeneXpert MRSA Assay (FDA approved for NASAL specimens only), is one component of a comprehensive MRSA colonization surveillance program. It is not intended to diagnose MRSA infection nor to guide or monitor treatment for MRSA infections.   Glucose, capillary     Status: Abnormal   Collection Time: 01/27/15 11:29 PM  Result Value Ref Range   Glucose-Capillary 121 (H) 65 - 99 mg/dL  CBC     Status: Abnormal   Collection Time: 01/28/15 12:52 AM  Result Value Ref Range   WBC 9.5 4.0 - 10.5 K/uL   RBC 3.54 (L) 3.87 - 5.11 MIL/uL   Hemoglobin 11.4 (L) 12.0 - 15.0 g/dL   HCT 35.7 (L) 36.0 - 46.0 %   MCV 100.8 (H) 78.0 - 100.0 fL   MCH 32.2 26.0 - 34.0 pg   MCHC 31.9 30.0 - 36.0 g/dL   RDW 13.7 11.5 - 15.5 %   Platelets 232 150 - 400 K/uL  Comprehensive metabolic panel     Status: Abnormal   Collection Time: 01/28/15  4:26 AM  Result Value Ref Range   Sodium 142 135 - 145 mmol/L   Potassium 4.7 3.5 - 5.1 mmol/L   Chloride 110 101 - 111 mmol/L   CO2 24 22 - 32 mmol/L   Glucose, Bld 146 (H) 65 - 99 mg/dL   BUN 20 6 - 20 mg/dL   Creatinine, Ser 1.14 (H) 0.44 - 1.00 mg/dL   Calcium 9.2 8.9 - 10.3 mg/dL   Total Protein 5.8 (L) 6.5 - 8.1 g/dL   Albumin 3.0 (L) 3.5 - 5.0 g/dL   AST 16 15 - 41 U/L   ALT 10 (L) 14 - 54 U/L   Alkaline Phosphatase 59 38 - 126 U/L   Total Bilirubin 0.9 0.3 - 1.2 mg/dL   GFR calc non Af Amer 47 (L) >60 mL/min   GFR calc Af Amer 54 (L) >60 mL/min    Comment: (NOTE) The eGFR has been calculated using the CKD EPI equation. This calculation has not been validated in all clinical situations. eGFR's persistently <60 mL/min signify possible  Chronic Kidney Disease.    Anion gap 8 5 - 15  CBC     Status: Abnormal   Collection Time: 01/28/15  4:26 AM  Result Value Ref Range   WBC 7.9 4.0 - 10.5 K/uL   RBC 3.14 (L) 3.87 - 5.11 MIL/uL   Hemoglobin 10.3 (L) 12.0 - 15.0 g/dL   HCT 31.6 (L) 36.0 - 46.0 %   MCV 100.6 (H) 78.0 - 100.0 fL    MCH 32.8 26.0 - 34.0 pg   MCHC 32.6 30.0 - 36.0 g/dL   RDW 14.0 11.5 - 15.5 %   Platelets 216 150 - 400 K/uL  Iron and TIBC     Status: Abnormal   Collection Time: 01/28/15  4:26 AM  Result Value Ref Range   Iron 26 (L) 28 - 170 ug/dL   TIBC 283 250 - 450 ug/dL   Saturation Ratios 9 (L) 10.4 - 31.8 %   UIBC 257 ug/dL  Ferritin     Status: None   Collection Time: 01/28/15  4:26 AM  Result Value Ref Range   Ferritin 53 11 - 307 ng/mL    No results found.  ROS negative except above Blood pressure 159/75, pulse 87, temperature 97.4 F (36.3 C), temperature source Oral, resp. rate 29, height 5' (1.524 m), weight 86.637 kg (191 lb), SpO2 100 %. Physical Exam vital signs stable afebrile no acute distress lungs are clear regular rate and rhythm abdomen is soft nontender except for some very minimal midepigastric discomfort good bowel sounds labs reviewed hemoglobin okay BUN and creatinine okay  Assessment/Plan: GI bleeding questionable etiology in a patient on aspirin and Plavix with history of stomach ulcers Plan: We discussed repeat endoscopy today and will proceed by my partner Dr. Oletta Lamas and in the meantime try to get her previous colonoscopy from [place] with further workup and plans pending endoscopic findings  Kristy Morris 01/28/2015, 12:25 PM

## 2015-01-29 ENCOUNTER — Encounter (HOSPITAL_COMMUNITY): Payer: Self-pay | Admitting: Gastroenterology

## 2015-01-29 DIAGNOSIS — R000 Tachycardia, unspecified: Secondary | ICD-10-CM

## 2015-01-29 LAB — CBC
HCT: 26.9 % — ABNORMAL LOW (ref 36.0–46.0)
HCT: 28.5 % — ABNORMAL LOW (ref 36.0–46.0)
HEMOGLOBIN: 8.6 g/dL — AB (ref 12.0–15.0)
Hemoglobin: 9.3 g/dL — ABNORMAL LOW (ref 12.0–15.0)
MCH: 32 pg (ref 26.0–34.0)
MCH: 32.9 pg (ref 26.0–34.0)
MCHC: 32 g/dL (ref 30.0–36.0)
MCHC: 32.6 g/dL (ref 30.0–36.0)
MCV: 100 fL (ref 78.0–100.0)
MCV: 100.7 fL — ABNORMAL HIGH (ref 78.0–100.0)
PLATELETS: 209 10*3/uL (ref 150–400)
PLATELETS: 226 10*3/uL (ref 150–400)
RBC: 2.69 MIL/uL — AB (ref 3.87–5.11)
RBC: 2.83 MIL/uL — ABNORMAL LOW (ref 3.87–5.11)
RDW: 14 % (ref 11.5–15.5)
RDW: 14.2 % (ref 11.5–15.5)
WBC: 5.5 10*3/uL (ref 4.0–10.5)
WBC: 7.1 10*3/uL (ref 4.0–10.5)

## 2015-01-29 LAB — COMPREHENSIVE METABOLIC PANEL
ALK PHOS: 47 U/L (ref 38–126)
ALT: 11 U/L — ABNORMAL LOW (ref 14–54)
ANION GAP: 9 (ref 5–15)
AST: 18 U/L (ref 15–41)
Albumin: 2.9 g/dL — ABNORMAL LOW (ref 3.5–5.0)
BUN: 17 mg/dL (ref 6–20)
CALCIUM: 8.6 mg/dL — AB (ref 8.9–10.3)
CO2: 23 mmol/L (ref 22–32)
Chloride: 110 mmol/L (ref 101–111)
Creatinine, Ser: 1.11 mg/dL — ABNORMAL HIGH (ref 0.44–1.00)
GFR, EST AFRICAN AMERICAN: 56 mL/min — AB (ref >60)
GFR, EST NON AFRICAN AMERICAN: 48 mL/min — AB (ref >60)
Glucose, Bld: 97 mg/dL (ref 65–99)
Potassium: 3.5 mmol/L (ref 3.5–5.1)
SODIUM: 142 mmol/L (ref 135–145)
TOTAL PROTEIN: 5.3 g/dL — AB (ref 6.5–8.1)
Total Bilirubin: 1 mg/dL (ref 0.3–1.2)

## 2015-01-29 LAB — GLUCOSE, CAPILLARY: GLUCOSE-CAPILLARY: 93 mg/dL (ref 65–99)

## 2015-01-29 MED ORDER — ONDANSETRON HCL 4 MG/2ML IJ SOLN: 4.0000 mg | Freq: Once | INTRAMUSCULAR | Status: DC | PRN

## 2015-01-29 MED ORDER — POTASSIUM CHLORIDE CRYS ER 20 MEQ PO TBCR
40.0000 meq | EXTENDED_RELEASE_TABLET | Freq: Once | ORAL | Status: AC
  Administered 2015-01-29: 40 meq via ORAL
  Filled 2015-01-29: qty 2

## 2015-01-29 MED ORDER — BISACODYL 5 MG PO TBEC
10.0000 mg | DELAYED_RELEASE_TABLET | Freq: Every day | ORAL | Status: DC | PRN
  Administered 2015-01-29: 10 mg via ORAL
  Filled 2015-01-29: qty 2

## 2015-01-29 MED ORDER — FENTANYL CITRATE (PF) 100 MCG/2ML IJ SOLN: 25.0000 ug | INTRAMUSCULAR | Status: DC | PRN

## 2015-01-29 MED ORDER — PEG 3350-KCL-NA BICARB-NACL 420 G PO SOLR
4000.0000 mL | Freq: Once | ORAL | Status: AC
  Administered 2015-01-29: 4000 mL via ORAL
  Filled 2015-01-29: qty 4000

## 2015-01-29 NOTE — Progress Notes (Signed)
[facility] PROGRESS NOTE  Kristy Morris TOI:[id number] DOB: [birth year] DOA: 01/27/2015 PCP: Sid Falcon, MD  Assessment/Plan: #1 GI bleed/history of peptic ulcer disease Patient presenting with bloody bowel movements. Patient noted to be on aspirin and Plavix prior to admission which are currently on hold. Last cardiac stent was approximately 5 years ago. GI consulted patient seen in consultation by Dr. Aundra Millet. Patient underwent upper endoscopy on 01/28/2015 which showed shallow antral ulcers. Probable source of her recent bleed. Large hiatal hernia. Pathology negative for any dysplasia, malignancy or atypia. Patient's hemoglobin currently at 8.6 from 9.8. Continue serial CBCs. Patient was seen in consultation by gastroenterology were recommended colonoscopy to be done tomorrow for further evaluation. Continue PPI. Follow.  #2 acute blood loss anemia Secondary to problem #1. Transfusion threshold hemoglobin less than 8.  #3 acute renal failure Likely secondary to prerenal azotemia secondary to GI bleed in the setting of dehydration. Renal function improvement. Continue hydration.  #4 hypertension Stable.  #6 sinus tachycardia Likely secondary to acute bleeding. Improved. Follow.  #7 rheumatoid arthritis Outpatient follow-up.  #8 morbid obesity  #9 history of CVA aspirin and Plavix on hold secondary to problem #1. Defer to GI when these may be resumed.  #10 prophylaxis PPI for GI prophylaxis. SCDs for DVT prophylaxis.  Code Status: Full Family Communication: Updated patient. No family present. Disposition Plan: transfer to telemetry.    Consultants:  Gastroenterology: Dr. Ewing Schlein 01/28/2015  Procedures:  Upper endoscopy 01/28/2015 per Dr. Randa Evens--- shallow antral ulcers. Probable source of recent bleeding. Patient denies use of NSAIDs. Large hiatal hernia. Pathology pending.   Antibiotics:  None  HPI/Subjective: Patient states was told BM this morning was  bloody. Patient denies any shortness of breath. No chest pain.  Objective: Filed Vitals:   01/29/15 0754 01/29/15 1200  BP: 140/65 131/54  Pulse: 80 78  Temp: 97.9 F (36.6 C) 98.3 F (36.8 C)  Resp: 19 18    Intake/Output Summary (Last 24 hours) at 01/29/15 1337 Last data filed at 01/29/15 0950  Gross per 24 hour  Intake   2035 ml  Output    925 ml  Net   1110 ml   Filed Weights   01/27/15 1948 01/28/15 1344 01/29/15 0427  Weight: 86.637 kg (191 lb) 86.637 kg (191 lb) 91.3 kg (201 lb 4.5 oz)    Exam:   General:  NAD  Cardiovascular: RRR  Respiratory: CTAB  Abdomen: Soft/NT/ND/+BS  Musculoskeletal: No c/c/e  Data Reviewed: Basic Metabolic Panel:  Recent Labs Lab 01/27/15 2030 01/28/15 0426 01/29/15 0300  NA 138 142 142  K 4.3 4.7 3.5  CL 108 110 110  CO2 22 24 23   GLUCOSE 140* 146* 97  BUN 25* 20 17  CREATININE 1.17* 1.14* 1.11*  CALCIUM 9.4 9.2 8.6*   Liver Function Tests:  Recent Labs Lab 01/27/15 2030 01/28/15 0426 01/29/15 0300  AST 18 16 18   ALT 13* 10* 11*  ALKPHOS 75 59 47  BILITOT 0.8 0.9 1.0  PROT 7.1 5.8* 5.3*  ALBUMIN 3.8 3.0* 2.9*   No results for input(s): LIPASE, AMYLASE in the last 168 hours. No results for input(s): AMMONIA in the last 168 hours. CBC:  Recent Labs Lab 01/27/15 2030 01/28/15 0052 01/28/15 0426 01/28/15 1632 01/29/15 0300  WBC 9.8 9.5 7.9 7.5 5.5  NEUTROABS 8.2*  --   --   --   --   HGB 11.7* 11.4* 10.3* 9.8* 8.6*  HCT 36.4 35.7* 31.6* 30.4* 26.9*  MCV 101.1*  100.8* 100.6* 99.7 100.0  PLT 255 232 216 237 209   Cardiac Enzymes: No results for input(s): CKTOTAL, CKMB, CKMBINDEX, TROPONINI in the last 168 hours. BNP (last 3 results) No results for input(s): BNP in the last 8760 hours.  ProBNP (last 3 results) No results for input(s): PROBNP in the last 8760 hours.  CBG:  Recent Labs Lab 01/27/15 2329 01/29/15 0751  GLUCAP 121* 93    Recent Results (from the past 240 hour(s))  MRSA PCR  Screening     Status: None   Collection Time: 01/27/15 11:24 PM  Result Value Ref Range Status   MRSA by PCR NEGATIVE NEGATIVE Final    Comment:        The GeneXpert MRSA Assay (FDA approved for NASAL specimens only), is one component of a comprehensive MRSA colonization surveillance program. It is not intended to diagnose MRSA infection nor to guide or monitor treatment for MRSA infections.      Studies: No results found.  Scheduled Meds: . mometasone-formoterol  2 puff Inhalation BID  . pantoprazole (PROTONIX) IV  80 mg Intravenous Once  . [START ON 01/31/2015] pantoprazole (PROTONIX) IV  40 mg Intravenous Q12H  . polyethylene glycol-electrolytes  4,000 mL Oral Once  . rosuvastatin  20 mg Oral Daily   Continuous Infusions: . sodium chloride 100 mL/hr at 01/29/15 0706  . pantoprozole (PROTONIX) infusion 8 mg/hr (01/29/15 1248)    Active Problems:   GI bleed   Acute blood loss anemia   Essential hypertension   Sinus tachycardia (HCC)   Leg swelling   Morbid obesity (HCC)    Time spent: 35 mins    [facility] MD [facility] Pager [phone]. If 7PM-7AM, please contact night-coverage at [email or web address], password [facility] 01/29/2015, 1:37 PM  LOS: 2 days

## 2015-01-29 NOTE — Progress Notes (Signed)
[facility] PROGRESS NOTE Subjective patient doing well after her EGD which showed some very small gastric ulcers. She reports that she had a colonoscopy about 3 years ago and [place] that was okay. She's not passed any more blood but her hemoglobin dropped 8.6 this morning. It was 11.7 on admission.  Objective: Vital signs in last 24 hours: Temp:  [97.3 F (36.3 C)-98 F (36.7 C)] 98 F (36.7 C) (12/22 0430) Pulse Rate:  [76-87] 82 (12/22 0610) Resp:  [18-35] 20 (12/22 0610) BP: (111-169)/(35-99) 140/65 mmHg (12/22 0610) SpO2:  [97 %-100 %] 99 % (12/22 0610) Weight:  [86.637 kg (191 lb)-91.3 kg (201 lb 4.5 oz)] 91.3 kg (201 lb 4.5 oz) (12/22 0427) Last BM Date: 01/28/15  Intake/Output from previous day: 12/21 0701 - 12/22 0700 In: 2050 [I.V.:2050] Out: 1325 [Urine:1325] Intake/Output this shift:    PE: General-- no acute distress  Abdomen-- soft nontender  Lab Results:  Recent Labs  01/27/15 2030 01/28/15 0052 01/28/15 0426 01/28/15 1632 01/29/15 0300  WBC 9.8 9.5 7.9 7.5 5.5  HGB 11.7* 11.4* 10.3* 9.8* 8.6*  HCT 36.4 35.7* 31.6* 30.4* 26.9*  PLT 255 232 216 237 209   BMET  Recent Labs  01/27/15 2030 01/28/15 0426 01/29/15 0300  NA 138 142 142  K 4.3 4.7 3.5  CL 108 110 110  CO2 22 24 23   CREATININE 1.17* 1.14* 1.11*   LFT  Recent Labs  01/27/15 2030 01/28/15 0426 01/29/15 0300  PROT 7.1 5.8* 5.3*  AST 18 16 18   ALT 13* 10* 11*  ALKPHOS 75 59 47  BILITOT 0.8 0.9 1.0  BILIDIR 0.3  --   --   IBILI 0.5  --   --    PT/INR  Recent Labs  01/27/15 2030  LABPROT 14.4  INR 1.10   PANCREAS No results for input(s): LIPASE in the last 72 hours.       Studies/Results: No results found.  Medications: I have reviewed the patient's current medications.  Assessment/Plan: 1. G.I. bleeding. She has been on aspirin Plavix and very small ulcers found on EGD is difficult to explain the degree of hemoglobin drop of these very small  ulcerations. Discuss colonoscopy with patient and her daughter and she has agreed to go ahead with that today. He is scheduled for 1 PM tomorrow. The procedure has been discussed with her.   Toryn Dewalt JR,Cigi Bega L 01/29/2015, 7:41 AM  Pager: [phone] If no answer or after hours call [phone]

## 2015-01-29 NOTE — Progress Notes (Signed)
Pending transfer orders to [id number]. Attempted report and provided call back number.

## 2015-01-29 NOTE — Progress Notes (Signed)
Utilization Review Completed.  

## 2015-01-30 ENCOUNTER — Encounter (HOSPITAL_COMMUNITY)
Admission: EM | Disposition: A | Discharge status: Home / Self Care | Payer: Self-pay | Source: Home / Self Care | Attending: Internal Medicine

## 2015-01-30 ENCOUNTER — Inpatient Hospital Stay (HOSPITAL_COMMUNITY): Payer: Medicare Other | Admitting: Anesthesiology

## 2015-01-30 ENCOUNTER — Encounter (HOSPITAL_COMMUNITY): Payer: Self-pay | Admitting: *Deleted

## 2015-01-30 DIAGNOSIS — D62 Acute posthemorrhagic anemia: Secondary | ICD-10-CM

## 2015-01-30 DIAGNOSIS — I10 Essential (primary) hypertension: Secondary | ICD-10-CM

## 2015-01-30 DIAGNOSIS — K922 Gastrointestinal hemorrhage, unspecified: Secondary | ICD-10-CM

## 2015-01-30 HISTORY — PX: COLONOSCOPY: SHX5424

## 2015-01-30 LAB — CBC
HEMATOCRIT: 24.7 % — AB (ref 36.0–46.0)
HEMATOCRIT: 25 % — AB (ref 36.0–46.0)
HEMOGLOBIN: 8.2 g/dL — AB (ref 12.0–15.0)
Hemoglobin: 7.9 g/dL — ABNORMAL LOW (ref 12.0–15.0)
MCH: 32.6 pg (ref 26.0–34.0)
MCH: 32.7 pg (ref 26.0–34.0)
MCHC: 32 g/dL (ref 30.0–36.0)
MCHC: 32.8 g/dL (ref 30.0–36.0)
MCV: 102.1 fL — AB (ref 78.0–100.0)
MCV: 99.6 fL (ref 78.0–100.0)
Platelets: 186 10*3/uL (ref 150–400)
Platelets: 199 10*3/uL (ref 150–400)
RBC: 2.42 MIL/uL — ABNORMAL LOW (ref 3.87–5.11)
RBC: 2.51 MIL/uL — ABNORMAL LOW (ref 3.87–5.11)
RDW: 14.3 % (ref 11.5–15.5)
RDW: 14.3 % (ref 11.5–15.5)
WBC: 4.7 10*3/uL (ref 4.0–10.5)
WBC: 5.6 10*3/uL (ref 4.0–10.5)

## 2015-01-30 LAB — COMPREHENSIVE METABOLIC PANEL
ALBUMIN: 2.8 g/dL — AB (ref 3.5–5.0)
ALT: 12 U/L — ABNORMAL LOW (ref 14–54)
AST: 22 U/L (ref 15–41)
Alkaline Phosphatase: 46 U/L (ref 38–126)
Anion gap: 7 (ref 5–15)
BUN: 16 mg/dL (ref 6–20)
CHLORIDE: 115 mmol/L — AB (ref 101–111)
CO2: 21 mmol/L — AB (ref 22–32)
Calcium: 8.6 mg/dL — ABNORMAL LOW (ref 8.9–10.3)
Creatinine, Ser: 1.02 mg/dL — ABNORMAL HIGH (ref 0.44–1.00)
GFR calc Af Amer: >60 mL/min (ref >60)
GFR calc non Af Amer: 53 mL/min — ABNORMAL LOW (ref >60)
GLUCOSE: 100 mg/dL — AB (ref 65–99)
POTASSIUM: 3.8 mmol/L (ref 3.5–5.1)
SODIUM: 143 mmol/L (ref 135–145)
Total Bilirubin: 0.6 mg/dL (ref 0.3–1.2)
Total Protein: 5.3 g/dL — ABNORMAL LOW (ref 6.5–8.1)

## 2015-01-30 LAB — GLUCOSE, CAPILLARY
GLUCOSE-CAPILLARY: 120 mg/dL — AB (ref 65–99)
GLUCOSE-CAPILLARY: 149 mg/dL — AB (ref 65–99)
Glucose-Capillary: 108 mg/dL — ABNORMAL HIGH (ref 65–99)
Glucose-Capillary: 95 mg/dL (ref 65–99)

## 2015-01-30 SURGERY — COLONOSCOPY
Anesthesia: Monitor Anesthesia Care

## 2015-01-30 MED ORDER — PANTOPRAZOLE SODIUM 40 MG PO TBEC
40.0000 mg | DELAYED_RELEASE_TABLET | Freq: Two times a day (BID) | ORAL | Status: DC
  Administered 2015-01-30 – 2015-01-31 (×2): 40 mg via ORAL
  Filled 2015-01-30 (×2): qty 1

## 2015-01-30 MED ORDER — LACTATED RINGERS IV SOLN
INTRAVENOUS | Status: DC | PRN
  Administered 2015-01-30: 12:00:00 via INTRAVENOUS

## 2015-01-30 MED ORDER — TORSEMIDE 5 MG PO TABS
5.0000 mg | ORAL_TABLET | Freq: Two times a day (BID) | ORAL | Status: DC
  Administered 2015-01-30 – 2015-01-31 (×2): 5 mg via ORAL
  Filled 2015-01-30 (×4): qty 1

## 2015-01-30 MED ORDER — ALPRAZOLAM 0.5 MG PO TABS
0.5000 mg | ORAL_TABLET | Freq: Two times a day (BID) | ORAL | Status: DC | PRN
Start: 2015-01-30 — End: 2015-01-31

## 2015-01-30 MED ORDER — SODIUM CHLORIDE 0.9 % IV SOLN: INTRAVENOUS | Status: DC

## 2015-01-30 MED ORDER — LABETALOL HCL 5 MG/ML IV SOLN
INTRAVENOUS | Status: DC | PRN
  Administered 2015-01-30: 5 mg via INTRAVENOUS

## 2015-01-30 MED ORDER — SODIUM CHLORIDE 0.9 % IV SOLN: INTRAVENOUS | Status: DC | PRN

## 2015-01-30 MED ORDER — PROPOFOL 500 MG/50ML IV EMUL
INTRAVENOUS | Status: DC | PRN
  Administered 2015-01-30: 75 ug/kg/min via INTRAVENOUS

## 2015-01-30 MED ORDER — CARVEDILOL PHOSPHATE ER 80 MG PO CP24
80.0000 mg | ORAL_CAPSULE | Freq: Every day | ORAL | Status: DC
  Filled 2015-01-30: qty 1

## 2015-01-30 MED ORDER — CARVEDILOL PHOSPHATE ER 80 MG PO CP24
80.0000 mg | ORAL_CAPSULE | Freq: Every day | ORAL | Status: DC
  Administered 2015-01-30 – 2015-01-31 (×2): 80 mg via ORAL
  Filled 2015-01-30 (×2): qty 1

## 2015-01-30 MED ORDER — PROPOFOL 10 MG/ML IV BOLUS
INTRAVENOUS | Status: DC | PRN
  Administered 2015-01-30 (×2): 20 mg via INTRAVENOUS

## 2015-01-30 MED ORDER — LIDOCAINE HCL (CARDIAC) 20 MG/ML IV SOLN
INTRAVENOUS | Status: DC | PRN
  Administered 2015-01-30: 80 mg via INTRATRACHEAL

## 2015-01-30 MED ORDER — ASPIRIN EC 81 MG PO TBEC
81.0000 mg | DELAYED_RELEASE_TABLET | Freq: Every day | ORAL | Status: DC
  Filled 2015-01-30: qty 1

## 2015-01-30 NOTE — Anesthesia Preprocedure Evaluation (Signed)
Anesthesia Evaluation  Patient identified by MRN, date of birth, ID band Patient awake    Reviewed: Allergy & Precautions, NPO status , Patient's Chart, lab work & pertinent test results, reviewed documented beta blocker date and time   History of Anesthesia Complications Negative for: history of anesthetic complications  Airway Mallampati: III  TM Distance: >3 FB Neck ROM: Full    Dental  (+) Dental Advisory Given, Upper Dentures   Pulmonary COPD,  COPD inhaler,    breath sounds clear to auscultation + decreased breath sounds      Cardiovascular hypertension, Pt. on medications and Pt. on home beta blockers + CAD, + Past MI and + Cardiac Stents  Normal cardiovascular exam+ pacemaker  Rhythm:Regular Rate:Normal     Neuro/Psych CVA (RLE weakness, numbness), Residual Symptoms negative psych ROS   GI/Hepatic Neg liver ROS, GERD  Medicated,  Endo/Other  diabetes, Well ControlledObesity   Renal/GU negative Renal ROS     Musculoskeletal negative musculoskeletal ROS (+)   Abdominal   Peds  Hematology  (+) Blood dyscrasia, anemia ,   Anesthesia Other Findings Day of surgery medications reviewed with the patient.  Reproductive/Obstetrics                             Anesthesia Physical Anesthesia Plan  ASA: III  Anesthesia Plan: MAC   Post-op Pain Management:    Induction:   Airway Management Planned: Simple Face Mask, Natural Airway and Nasal Cannula  Additional Equipment:   Intra-op Plan:   Post-operative Plan:   Informed Consent: I have reviewed the patients History and Physical, chart, labs and discussed the procedure including the risks, benefits and alternatives for the proposed anesthesia with the patient or authorized representative who has indicated his/her understanding and acceptance.   Dental advisory given  Plan Discussed with: CRNA and Surgeon  Anesthesia Plan  Comments:         Anesthesia Quick Evaluation

## 2015-01-30 NOTE — Op Note (Signed)
[facility] [address] [place], [address]   COLONOSCOPY PROCEDURE REPORT  PATIENT: Kristy Morris, Kristy Morris  MR#: [id number] BIRTHDATE: 09-06-1940 , 74  yrs. old GENDER: female ENDOSCOPIST: Carman Ching, MD REFERRED BY:  [facility] PCP: Dr. Loyal Jacobson PROCEDURE DATE:  Feb 28, 2015 PROCEDURE:   colonoscopy ASA CLASS:   class III INDICATIONS:anemia and positive stools. EGD showed small gastric ulcerations couple days ago MEDICATIONS: propofol 140 mg IV  DESCRIPTION OF PROCEDURE:   After the risks and benefits and of the procedure were explained, informed consent was obtained.  digital exam was normal         The [employer] Adult Colon [id number]  endoscope was introduced through the anus and advanced to the cecum identified by the appendiceal orifice and ileocecal valve.      . The quality of the prep was    .  The instrument was then slowly withdrawn as the colon was fully examined. Estimated blood loss is zero unless otherwise noted in this procedure report. The scope was withdrawn in the mucosa carefully examined upon withdrawal. There were no polyps masses or bleeding seen throughout the entire colon. There was mild diverticulosis seen in the sigmoid colon but was not actively bleeding. Retro flex view of the rectum was normal   The scope was then withdrawn from the patient and the procedure completed.  WITHDRAWAL TIME:  COMPLICATIONS: There were no immediate complications. ENDOSCOPIC IMPRESSION: 1. stool positive for FOB with no gross lesion seen in the colon 2. Mild diverticulosis RECOMMENDATIONS: patient could be discharged on fiber supplements and follow-up with Dr. Leonette Most to have hemoglobin checked in a week or 2.  REPEAT EXAM:  cc:Dr Loyal Jacobson  _______________________________ eSigned:  Carman Ching, MD 02-28-2015 2:06 PM   CPT CODES: ICD CODES:  The ICD and CPT codes recommended by this software are interpretations from  the data that the clinical staff has captured with the software.  The verification of the translation of this report to the ICD and CPT codes and modifiers is the sole responsibility of the health care institution and practicing physician where this report was generated.  [employer] will not be held responsible for the validity of the ICD and CPT codes included on this report.  AMA assumes no liability for data contained or not contained herein. CPT is a Publishing rights manager of the [employer].

## 2015-01-30 NOTE — Progress Notes (Signed)
Pt was admitted into the unit per strecher accompanied by a nurse tech, with NS infusion running at 149ml/h, pt oriented to the unit and equipment, call light and phone within reach

## 2015-01-30 NOTE — Transfer of Care (Signed)
Immediate Anesthesia Transfer of Care Note  Patient: Kristy Morris  Procedure(s) Performed: Procedure(s): COLONOSCOPY (N/A)  Patient Location: PACU  Anesthesia Type:MAC  Level of Consciousness: awake, alert , oriented and patient cooperative  Airway & Oxygen Therapy: Patient Spontanous Breathing and Patient connected to face mask oxygen  Post-op Assessment: Report given to RN and Post -op Vital signs reviewed and stable  Post vital signs: Reviewed and stable  Last Vitals:  Filed Vitals:   01/30/15 1212 01/30/15 1401  BP: 195/71 136/54  Pulse:  76  Temp:    Resp:  27    Complications: No apparent anesthesia complications

## 2015-01-30 NOTE — Care Management Important Message (Signed)
Important Message  Patient Details  Name: Kristy Morris MRN: [id number] Date of Birth: [birth year]   Medicare Important Message Given:  Yes    [name] 01/30/2015, 11:58 AM

## 2015-01-30 NOTE — Interval H&P Note (Signed)
History and Physical Interval Note:  01/30/2015 12:07 PM  Kristy Morris  has presented today for surgery, with the diagnosis of GI bleed  The various methods of treatment have been discussed with the patient and family. After consideration of risks, benefits and other options for treatment, the patient has consented to  Procedure(s): COLONOSCOPY (N/A) as a surgical intervention .  The patient's history has been reviewed, patient examined, no change in status, stable for surgery.  I have reviewed the patient's chart and labs.  Questions were answered to the patient's satisfaction.     [name],[name]

## 2015-01-30 NOTE — Progress Notes (Signed)
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 She went to ED. The bleeding was dark in color with clots. It was not melena or bridght red blood. She had intermittent abdominal cramping associated with it. The cramping was in the hypogastric region. The bleeding occurred after the cramping stopped. Her last colonoscopy was performed 2 years ago. It was "okay" and was done at [facility].  HPI/Subjective: Pt resting comfortably in bed.  Denies any further obvious bleeding today.  No n/v, cp, sob, or abdom pain.    Assessment/Plan:  GI bleed / history of peptic ulcer disease on aspirin and Plavix prior to admission - last cardiac stent was approximately 5 years ago - EGD on 12/21 showed shallow antral ulcers as the probable source of her bleed - pathology negative for any dysplasia, malignancy or atypia - colo completed 12/23 w/o signif findings   acute blood loss anemia With hx of CAD goal is Hgb > 7.9 - Hgb has dropped some over last 24hrs - unclear if this is simply equilibration or evidence of further loss - recheck Hgb this evening and transfuse if remains < 8.0 - recheck in AM as well   acute renal failure secondary to prerenal azotemia secondary to GI bleed in the setting of dehydration - improving w/ volume expansion   HTN BP poorly controlled - adjust med tx and follow   sinus tachycardia secondary to acute bleeding - resolved   RA On no apparent long term medications - follow  morbid obesity - Body mass index is 37.33 kg/(m^2).  history of  CVA aspirin and Plavix on hold secondary to problem #1 - resume ASA alone for now and follow   Code Status: FULL Family Communication: no family present at time of exam Disposition Plan: possible d/c home in next 24-48hrs if Hgb stable and tolerates diet   Consultants: [facility]  Procedures: 12/21 - EGD - shallow antral ulcers - large HH 12/23 - colo - no evidence of bleeding   Antibiotics: none  DVT prophylaxis: SCDs  Objective: Blood pressure 193/87, pulse 78, temperature 97.5 F (36.4 C), temperature source Oral, resp. rate 19, height 5' (1.524 m), weight 86.7 kg (191 lb 2.2 oz), SpO2 99 %.  Intake/Output Summary (Last 24 hours) at 01/30/15 1609 Last data filed at 01/30/15 1404  Gross per 24 hour  Intake   1895 ml  Output      0 ml  Net   1895 ml   Exam: General: No acute respiratory distress Lungs: Clear to auscultation bilaterally without wheezes or crackles Cardiovascular: Regular rate and rhythm without murmur gallop or rub normal S1 and S2 Abdomen: Nontender, nondistended, soft, bowel sounds positive, no rebound, no ascites, no appreciable mass Extremities: No significant cyanosis, clubbing, or edema bilateral lower extremities  Data Reviewed: Basic Metabolic Panel:  Recent Labs Lab 01/27/15 2030 01/28/15 0426 01/29/15 0300 01/30/15 0606  NA 138 142 142 143  K 4.3 4.7 3.5 3.8  CL 108 110 110 115*  CO2 22 24 23  21*  GLUCOSE 140* 146* 97 100*  BUN 25* 20 17 16   CREATININE 1.17*  1.14* 1.11* 1.02*  CALCIUM 9.4 9.2 8.6* 8.6*    CBC:  Recent Labs Lab 01/27/15 2030  01/28/15 0426 01/28/15 1632 01/29/15 0300 01/29/15 1642 01/30/15 0606  WBC 9.8  < > 7.9 7.5 5.5 7.1 4.7  NEUTROABS 8.2*  --   --   --   --   --   --   HGB 11.7*  < > 10.3* 9.8* 8.6* 9.3* 7.9*  HCT 36.4  < > 31.6* 30.4* 26.9* 28.5* 24.7*  MCV 101.1*  < > 100.6* 99.7 100.0 100.7* 102.1*  PLT 255  < > 216 237 209 226 186  < > = values in this interval not displayed.  Liver Function  Tests:  Recent Labs Lab 01/27/15 2030 01/28/15 0426 01/29/15 0300 01/30/15 0606  AST 18 16 18 22   ALT 13* 10* 11* 12*  ALKPHOS 75 59 47 46  BILITOT 0.8 0.9 1.0 0.6  PROT 7.1 5.8* 5.3* 5.3*  ALBUMIN 3.8 3.0* 2.9* 2.8*   Coags:  Recent Labs Lab 01/27/15 2030  INR 1.10   CBG:  Recent Labs Lab 01/27/15 2329 01/29/15 0751 01/30/15 0759 01/30/15 1158  GLUCAP 121* 93 108* 120*    Recent Results (from the past 240 hour(s))  MRSA PCR Screening     Status: None   Collection Time: 01/27/15 11:24 PM  Result Value Ref Range Status   MRSA by PCR NEGATIVE NEGATIVE Final    Comment:        The GeneXpert MRSA Assay (FDA approved for NASAL specimens only), is one component of a comprehensive MRSA colonization surveillance program. It is not intended to diagnose MRSA infection nor to guide or monitor treatment for MRSA infections.      Studies:   Recent x-ray studies have been reviewed in detail by the Attending Physician  Scheduled Meds:  Scheduled Meds: . mometasone-formoterol  2 puff Inhalation BID  . pantoprazole (PROTONIX) IV  80 mg Intravenous Once  . [START ON 01/31/2015] pantoprazole (PROTONIX) IV  40 mg Intravenous Q12H  . rosuvastatin  20 mg Oral Daily    Time spent on care of this patient: 35 mins   Jeimy Bickert T , MD   [facility] Office  [phone] Pager - Text Page per [phone] as per below:  On-Call/Text Page:      [email or web address]      password [email or web address]  If 7PM-7AM, please contact night-coverage [email or web address] Password [email or web address] 01/30/2015, 4:09 PM   LOS: 3 days

## 2015-01-30 NOTE — Care Management Note (Signed)
Case Management Note  Patient Details  Name: Shanice Poznanski MRN: [id number] Date of Birth: 10-Nov-1940  Subjective/Objective:   Patient home alone, patient will need pt eval.  NCM will cont to follow for dc needs.                 Action/Plan:   Expected Discharge Date:                  Expected Discharge Plan:  Home w Home Health Services  In-House Referral:     Discharge planning Services  CM Consult  Post Acute Care Choice:    Choice offered to:     DME Arranged:    DME Agency:     HH Arranged:    HH Agency:     Status of Service:  In process, will continue to follow  Medicare Important Message Given:  Yes Date Medicare IM Given:    Medicare IM give by:    Date Additional Medicare IM Given:    Additional Medicare Important Message give by:     If discussed at Long Length of Stay Meetings, dates discussed:    Additional Comments:  Leone Haven, RN 01/30/2015, 4:04 PM

## 2015-01-31 DIAGNOSIS — N183 Chronic kidney disease, stage 3 (moderate): Secondary | ICD-10-CM

## 2015-01-31 DIAGNOSIS — K254 Chronic or unspecified gastric ulcer with hemorrhage: Principal | ICD-10-CM

## 2015-01-31 LAB — COMPREHENSIVE METABOLIC PANEL
ALT: 13 U/L — ABNORMAL LOW (ref 14–54)
AST: 20 U/L (ref 15–41)
Albumin: 2.7 g/dL — ABNORMAL LOW (ref 3.5–5.0)
Alkaline Phosphatase: 46 U/L (ref 38–126)
Anion gap: 6 (ref 5–15)
BILIRUBIN TOTAL: 0.5 mg/dL (ref 0.3–1.2)
BUN: 13 mg/dL (ref 6–20)
CHLORIDE: 111 mmol/L (ref 101–111)
CO2: 25 mmol/L (ref 22–32)
CREATININE: 1.14 mg/dL — AB (ref 0.44–1.00)
Calcium: 9 mg/dL (ref 8.9–10.3)
GFR, EST AFRICAN AMERICAN: 54 mL/min — AB (ref >60)
GFR, EST NON AFRICAN AMERICAN: 47 mL/min — AB (ref >60)
Glucose, Bld: 128 mg/dL — ABNORMAL HIGH (ref 65–99)
POTASSIUM: 3.8 mmol/L (ref 3.5–5.1)
Sodium: 142 mmol/L (ref 135–145)
TOTAL PROTEIN: 5.3 g/dL — AB (ref 6.5–8.1)

## 2015-01-31 LAB — CBC
HEMATOCRIT: 25.4 % — AB (ref 36.0–46.0)
Hemoglobin: 8.3 g/dL — ABNORMAL LOW (ref 12.0–15.0)
MCH: 32.9 pg (ref 26.0–34.0)
MCHC: 32.7 g/dL (ref 30.0–36.0)
MCV: 100.8 fL — AB (ref 78.0–100.0)
PLATELETS: 192 10*3/uL (ref 150–400)
RBC: 2.52 MIL/uL — ABNORMAL LOW (ref 3.87–5.11)
RDW: 14.2 % (ref 11.5–15.5)
WBC: 5.5 10*3/uL (ref 4.0–10.5)

## 2015-01-31 MED ORDER — MUSCLE RUB 10-15 % EX CREA
1.0000 "application " | TOPICAL_CREAM | CUTANEOUS | Status: DC | PRN
  Filled 2015-01-31: qty 85

## 2015-01-31 MED ORDER — FERROUS SULFATE 325 (65 FE) MG PO TABS
325.0000 mg | ORAL_TABLET | Freq: Two times a day (BID) | ORAL | Status: DC
Start: 2015-01-31 — End: 2016-04-05

## 2015-01-31 MED ORDER — ACETAMINOPHEN 325 MG PO TABS
650.0000 mg | ORAL_TABLET | Freq: Four times a day (QID) | ORAL | Status: DC | PRN
  Administered 2015-01-31: 650 mg via ORAL
  Filled 2015-01-31: qty 2

## 2015-01-31 NOTE — Progress Notes (Signed)
Reviewed discharge instructions with pt and family.  PIV removed.  Pt denied any other needs at this time.  Pt given inhaler and muscle rub cream that was used while in hospital.  Pt taken to discharge location via wheelchair.

## 2015-01-31 NOTE — Discharge Instructions (Signed)

## 2015-01-31 NOTE — Discharge Summary (Addendum)
DISCHARGE SUMMARY  Kristy Morris  MR#: [id number]  DOB:06-12-1940  Date of Admission: 01/27/2015 Date of Discharge: 01/31/2015  Attending Physician:MCCLUNG,JEFFREY T  Patient's XBM:WUXLKG, Nolon Bussing, MD  Consults:  [facility]  Disposition: D/C home   Follow-up Appts:     Follow-up Information    Follow up with Sid Falcon, MD In 1 week.   Specialty:  Family Medicine   Contact information:   [address] [place] [address] [phone]       Tests Needing Follow-up: -recheck of renal function  -recheck of Hgb -check of BP - possible need to titrate medications  -determine if it is appropriate to resume Plavix no sooner than 7 days post d/c   Discharge Diagnoses: GI bleed due to gastric antral ulcer / history of peptic ulcer disease Acute blood loss anemia CKD stage 3? HTN Sinus tachycardia RA Morbid obesity - Body mass index is 37.33 kg/(m^2). 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 She went to ED. The bleeding was dark in color with clots. It was not melena or bridght red blood. She had intermittent abdominal cramping associated with it. The cramping was in the hypogastric region. The bleeding occurred after the cramping stopped. Her last colonoscopy was performed 2 years ago. It was "okay" and was done at [facility].  Hospital Course:  GI bleed due to gastric antral ulcer / history of peptic ulcer disease on aspirin and Plavix prior to admission - last cardiac stent was approximately 5 years ago - EGD on 12/21 showed shallow antral ulcers as the probable source of her bleed - pathology negative for any dysplasia, malignancy or  atypia - colo completed 12/23 w/o signif findings - no further gross blood loss at time of d/c   acute blood loss anemia With hx of CAD goal is Hgb > 7.9 - prior to d/c Hgb equilibrated at  ~8.2  ?CKD stage 3 Creatinine stable th/o hospital stay, w/ range of 1.02 - 1.17 - no evidence this is an acutely reversible issue - f/u as outpt suggested - did not resume ARB at time of d/c   HTN BP control improving at time of d/c - holding ARB at d/c due to renal insuff - f/u of BP indicated   Sinus tachycardia secondary to acute bleeding and BB withdrawal - resolved   RA On no apparent long term medications - follow  Morbid obesity - Body mass index is 37.33 kg/(m^2).  History of CVA aspirin and Plavix on hold th/o hospital stay secondary to bleeding - resumed ASA alone at time of d/c - if no further bleeding when seen in followup, and if Hgb is stable/improved, consider resuming Plavix 7 days after d/c      Medication List    STOP taking these medications        clopidogrel 75 MG tablet  Commonly known as:  PLAVIX     losartan 100 MG tablet  Commonly known as:  COZAAR      TAKE these medications        ALPRAZolam 0.5 MG tablet  Commonly known as:  XANAX  Take 0.5 mg by mouth 2 (two) times daily as needed for anxiety.     aspirin  81 MG tablet  Take 81 mg by mouth daily.     BENEFIBER PO  Take 5 mLs by mouth daily.     carvedilol 80 MG 24 hr capsule  Commonly known as:  COREG CR  Take 80 mg by mouth daily.     cholecalciferol 1000 UNITS tablet  Commonly known as:  VITAMIN D  Take 1,000 Units by mouth daily.     ferrous sulfate 325 (65 FE) MG tablet  Commonly known as:  FERROUSUL  Take 1 tablet (325 mg total) by mouth 2 (two) times daily with a meal.     Fluticasone-Salmeterol 250-50 MCG/DOSE Aepb  Commonly known as:  ADVAIR  Inhale 1 puff into the lungs 2 (two) times daily.     nitroGLYCERIN 0.4 MG SL tablet  Commonly known as:  NITROSTAT  Place 0.4 mg under the  tongue every 5 (five) minutes as needed for chest pain.     pantoprazole 40 MG tablet  Commonly known as:  PROTONIX  Take 40 mg by mouth 2 (two) times daily.     rosuvastatin 20 MG tablet  Commonly known as:  CRESTOR  Take 20 mg by mouth daily.     torsemide 5 MG tablet  Commonly known as:  DEMADEX  Take 5 mg by mouth 2 (two) times daily.     VITAMIN C PO  Take 1 tablet by mouth daily.        Day of Discharge BP 158/67 mmHg  Pulse 80  Temp(Src) 98.4 F (36.9 C) (Oral)  Resp 16  Ht 5' (1.524 m)  Wt 86.7 kg (191 lb 2.2 oz)  BMI 37.33 kg/m2  SpO2 99%  Physical Exam: General: No acute respiratory distress Lungs: Clear to auscultation bilaterally without wheezes or crackles Cardiovascular: Regular rate and rhythm without murmur gallop or rub normal S1 and S2 Abdomen: Nontender, nondistended, soft, bowel sounds positive, no rebound, no ascites, no appreciable mass Extremities: No significant cyanosis, clubbing, or edema bilateral lower extremities  Basic Metabolic Panel:  Recent Labs Lab 01/27/15 2030 01/28/15 0426 01/29/15 0300 01/30/15 0606 01/31/15 0401  NA 138 142 142 143 142  K 4.3 4.7 3.5 3.8 3.8  CL 108 110 110 115* 111  CO2 22 24 23  21* 25  GLUCOSE 140* 146* 97 100* 128*  BUN 25* 20 17 16 13   CREATININE 1.17* 1.14* 1.11* 1.02* 1.14*  CALCIUM 9.4 9.2 8.6* 8.6* 9.0    Liver Function Tests:  Recent Labs Lab 01/27/15 2030 01/28/15 0426 01/29/15 0300 01/30/15 0606 01/31/15 0401  AST 18 16 18 22 20   ALT 13* 10* 11* 12* 13*  ALKPHOS 75 59 47 46 46  BILITOT 0.8 0.9 1.0 0.6 0.5  PROT 7.1 5.8* 5.3* 5.3* 5.3*  ALBUMIN 3.8 3.0* 2.9* 2.8* 2.7*    Coags:  Recent Labs Lab 01/27/15 2030  INR 1.10   CBC:  Recent Labs Lab 01/27/15 2030  01/29/15 0300 01/29/15 1642 01/30/15 0606 01/30/15 1710 01/31/15 0401  WBC 9.8  < > 5.5 7.1 4.7 5.6 5.5  NEUTROABS 8.2*  --   --   --   --   --   --   HGB 11.7*  < > 8.6* 9.3* 7.9* 8.2* 8.3*  HCT 36.4  < >  26.9* 28.5* 24.7* 25.0* 25.4*  MCV 101.1*  < > 100.0 100.7* 102.1* 99.6 100.8*  PLT 255  < > 209 226 186 199 192  < > = values in this interval not displayed.  CBG:  Recent Labs Lab 01/29/15 0751 01/30/15 0759 01/30/15 1158 01/30/15 1647 01/30/15 2149  GLUCAP 93 108* 120* 95 149*    Recent Results (from the past 240 hour(s))  MRSA PCR Screening     Status: None   Collection Time: 01/27/15 11:24 PM  Result Value Ref Range Status   MRSA by PCR NEGATIVE NEGATIVE Final    Comment:        The GeneXpert MRSA Assay (FDA approved for NASAL specimens only), is one component of a comprehensive MRSA colonization surveillance program. It is not intended to diagnose MRSA infection nor to guide or monitor treatment for MRSA infections.       Time spent in discharge (includes decision making & examination of pt): >35 minutes  01/31/2015, 9:30 AM   [name], MD [facility] Office  [phone] Pager [phone]  On-Call/Text Page:      [email or web address]      password [facility]

## 2015-02-01 NOTE — Anesthesia Postprocedure Evaluation (Signed)
Anesthesia Post Note  Patient: Kristy Morris  Procedure(s) Performed: Procedure(s) (LRB): COLONOSCOPY (N/A)  Patient location during evaluation: Endoscopy Anesthesia Type: MAC Level of consciousness: awake Pain management: pain level controlled Vital Signs Assessment: post-procedure vital signs reviewed and stable Respiratory status: spontaneous breathing Cardiovascular status: stable Postop Assessment: no signs of nausea or vomiting Anesthetic complications: no    Last Vitals:  Filed Vitals:   01/30/15 1807 01/31/15 0552  BP: 165/79 158/67  Pulse:  80  Temp:  36.9 C  Resp:  16    Last Pain:  Filed Vitals:   01/31/15 1155  PainSc: 6                  [name]

## 2015-02-02 ENCOUNTER — Encounter (HOSPITAL_COMMUNITY): Payer: Self-pay | Admitting: Gastroenterology

## 2016-04-02 ENCOUNTER — Inpatient Hospital Stay (HOSPITAL_COMMUNITY)
Admission: EM | Admit: 2016-04-02 | Discharge: 2016-04-05 | DRG: 064 | Disposition: A | Discharge status: Hospice | Payer: Medicare Other | Attending: Internal Medicine | Admitting: Internal Medicine

## 2016-04-02 ENCOUNTER — Emergency Department (HOSPITAL_COMMUNITY): Payer: Medicare Other

## 2016-04-02 ENCOUNTER — Encounter (HOSPITAL_COMMUNITY): Payer: Self-pay | Admitting: Neurology

## 2016-04-02 ENCOUNTER — Inpatient Hospital Stay (HOSPITAL_COMMUNITY): Payer: Medicare Other

## 2016-04-02 DIAGNOSIS — I272 Pulmonary hypertension, unspecified: Secondary | ICD-10-CM | POA: Diagnosis present

## 2016-04-02 DIAGNOSIS — Z8249 Family history of ischemic heart disease and other diseases of the circulatory system: Secondary | ICD-10-CM | POA: Diagnosis not present

## 2016-04-02 DIAGNOSIS — N179 Acute kidney failure, unspecified: Secondary | ICD-10-CM | POA: Diagnosis present

## 2016-04-02 DIAGNOSIS — G4733 Obstructive sleep apnea (adult) (pediatric): Secondary | ICD-10-CM | POA: Diagnosis present

## 2016-04-02 DIAGNOSIS — I5033 Acute on chronic diastolic (congestive) heart failure: Secondary | ICD-10-CM | POA: Diagnosis present

## 2016-04-02 DIAGNOSIS — I2510 Atherosclerotic heart disease of native coronary artery without angina pectoris: Secondary | ICD-10-CM | POA: Diagnosis present

## 2016-04-02 DIAGNOSIS — Z90710 Acquired absence of both cervix and uterus: Secondary | ICD-10-CM

## 2016-04-02 DIAGNOSIS — R0602 Shortness of breath: Secondary | ICD-10-CM

## 2016-04-02 DIAGNOSIS — Z823 Family history of stroke: Secondary | ICD-10-CM

## 2016-04-02 DIAGNOSIS — Z6839 Body mass index (BMI) 39.0-39.9, adult: Secondary | ICD-10-CM

## 2016-04-02 DIAGNOSIS — Z79899 Other long term (current) drug therapy: Secondary | ICD-10-CM

## 2016-04-02 DIAGNOSIS — R29810 Facial weakness: Secondary | ICD-10-CM | POA: Diagnosis present

## 2016-04-02 DIAGNOSIS — H9190 Unspecified hearing loss, unspecified ear: Secondary | ICD-10-CM | POA: Diagnosis present

## 2016-04-02 DIAGNOSIS — I63512 Cerebral infarction due to unspecified occlusion or stenosis of left middle cerebral artery: Secondary | ICD-10-CM

## 2016-04-02 DIAGNOSIS — G9341 Metabolic encephalopathy: Secondary | ICD-10-CM | POA: Diagnosis present

## 2016-04-02 DIAGNOSIS — I5021 Acute systolic (congestive) heart failure: Secondary | ICD-10-CM | POA: Diagnosis not present

## 2016-04-02 DIAGNOSIS — R4701 Aphasia: Secondary | ICD-10-CM | POA: Diagnosis present

## 2016-04-02 DIAGNOSIS — G9340 Encephalopathy, unspecified: Secondary | ICD-10-CM

## 2016-04-02 DIAGNOSIS — I481 Persistent atrial fibrillation: Secondary | ICD-10-CM | POA: Diagnosis present

## 2016-04-02 DIAGNOSIS — I639 Cerebral infarction, unspecified: Secondary | ICD-10-CM

## 2016-04-02 DIAGNOSIS — I110 Hypertensive heart disease with heart failure: Secondary | ICD-10-CM | POA: Diagnosis present

## 2016-04-02 DIAGNOSIS — I509 Heart failure, unspecified: Secondary | ICD-10-CM

## 2016-04-02 DIAGNOSIS — Z66 Do not resuscitate: Secondary | ICD-10-CM | POA: Diagnosis present

## 2016-04-02 DIAGNOSIS — Z7982 Long term (current) use of aspirin: Secondary | ICD-10-CM

## 2016-04-02 DIAGNOSIS — Z955 Presence of coronary angioplasty implant and graft: Secondary | ICD-10-CM

## 2016-04-02 DIAGNOSIS — Z515 Encounter for palliative care: Secondary | ICD-10-CM | POA: Diagnosis present

## 2016-04-02 DIAGNOSIS — E1169 Type 2 diabetes mellitus with other specified complication: Secondary | ICD-10-CM | POA: Diagnosis not present

## 2016-04-02 DIAGNOSIS — I63412 Cerebral infarction due to embolism of left middle cerebral artery: Principal | ICD-10-CM | POA: Diagnosis present

## 2016-04-02 DIAGNOSIS — E785 Hyperlipidemia, unspecified: Secondary | ICD-10-CM | POA: Diagnosis present

## 2016-04-02 DIAGNOSIS — I169 Hypertensive crisis, unspecified: Secondary | ICD-10-CM | POA: Diagnosis present

## 2016-04-02 DIAGNOSIS — F419 Anxiety disorder, unspecified: Secondary | ICD-10-CM | POA: Diagnosis present

## 2016-04-02 DIAGNOSIS — I69351 Hemiplegia and hemiparesis following cerebral infarction affecting right dominant side: Secondary | ICD-10-CM | POA: Diagnosis not present

## 2016-04-02 DIAGNOSIS — D72829 Elevated white blood cell count, unspecified: Secondary | ICD-10-CM

## 2016-04-02 DIAGNOSIS — R451 Restlessness and agitation: Secondary | ICD-10-CM | POA: Diagnosis present

## 2016-04-02 DIAGNOSIS — J9690 Respiratory failure, unspecified, unspecified whether with hypoxia or hypercapnia: Secondary | ICD-10-CM | POA: Diagnosis present

## 2016-04-02 DIAGNOSIS — Z950 Presence of cardiac pacemaker: Secondary | ICD-10-CM | POA: Diagnosis present

## 2016-04-02 DIAGNOSIS — R414 Neurologic neglect syndrome: Secondary | ICD-10-CM | POA: Diagnosis present

## 2016-04-02 DIAGNOSIS — I071 Rheumatic tricuspid insufficiency: Secondary | ICD-10-CM | POA: Diagnosis present

## 2016-04-02 DIAGNOSIS — Z7189 Other specified counseling: Secondary | ICD-10-CM | POA: Diagnosis not present

## 2016-04-02 DIAGNOSIS — E669 Obesity, unspecified: Secondary | ICD-10-CM | POA: Diagnosis present

## 2016-04-02 DIAGNOSIS — J9601 Acute respiratory failure with hypoxia: Secondary | ICD-10-CM | POA: Diagnosis present

## 2016-04-02 DIAGNOSIS — I10 Essential (primary) hypertension: Secondary | ICD-10-CM | POA: Diagnosis not present

## 2016-04-02 DIAGNOSIS — I340 Nonrheumatic mitral (valve) insufficiency: Secondary | ICD-10-CM | POA: Diagnosis not present

## 2016-04-02 HISTORY — DX: Heart failure, unspecified: I50.9

## 2016-04-02 LAB — I-STAT ARTERIAL BLOOD GAS, ED
Acid-base deficit: 4 mmol/L — ABNORMAL HIGH (ref 0.0–2.0)
Bicarbonate: 21.3 mmol/L (ref 20.0–28.0)
O2 Saturation: 100 %
PCO2 ART: 40.7 mmHg (ref 32.0–48.0)
PH ART: 7.328 — AB (ref 7.350–7.450)
PO2 ART: 201 mmHg — AB (ref 83.0–108.0)
TCO2: 23 mmol/L (ref 0–100)

## 2016-04-02 LAB — URINALYSIS, ROUTINE W REFLEX MICROSCOPIC
Bilirubin Urine: NEGATIVE
GLUCOSE, UA: NEGATIVE mg/dL
Ketones, ur: NEGATIVE mg/dL
LEUKOCYTES UA: NEGATIVE
Nitrite: NEGATIVE
PROTEIN: 30 mg/dL — AB
SPECIFIC GRAVITY, URINE: 1.006 (ref 1.005–1.030)
SQUAMOUS EPITHELIAL / LPF: NONE SEEN
pH: 5 (ref 5.0–8.0)

## 2016-04-02 LAB — COMPREHENSIVE METABOLIC PANEL
ALBUMIN: 3.8 g/dL (ref 3.5–5.0)
ALT: 16 U/L (ref 14–54)
ANION GAP: 10 (ref 5–15)
AST: 26 U/L (ref 15–41)
Alkaline Phosphatase: 69 U/L (ref 38–126)
BILIRUBIN TOTAL: 1.4 mg/dL — AB (ref 0.3–1.2)
BUN: 43 mg/dL — ABNORMAL HIGH (ref 6–20)
CHLORIDE: 107 mmol/L (ref 101–111)
CO2: 23 mmol/L (ref 22–32)
Calcium: 9.9 mg/dL (ref 8.9–10.3)
Creatinine, Ser: 1.53 mg/dL — ABNORMAL HIGH (ref 0.44–1.00)
GFR calc Af Amer: 38 mL/min — ABNORMAL LOW (ref >60)
GFR calc non Af Amer: 32 mL/min — ABNORMAL LOW (ref >60)
GLUCOSE: 193 mg/dL — AB (ref 65–99)
POTASSIUM: 4.7 mmol/L (ref 3.5–5.1)
SODIUM: 140 mmol/L (ref 135–145)
TOTAL PROTEIN: 7.3 g/dL (ref 6.5–8.1)

## 2016-04-02 LAB — CBC WITH DIFFERENTIAL/PLATELET
BASOS ABS: 0 10*3/uL (ref 0.0–0.1)
BASOS PCT: 0 %
EOS ABS: 0 10*3/uL (ref 0.0–0.7)
EOS PCT: 0 %
HEMATOCRIT: 39.1 % (ref 36.0–46.0)
Hemoglobin: 12.8 g/dL (ref 12.0–15.0)
Lymphocytes Relative: 7 %
Lymphs Abs: 0.9 10*3/uL (ref 0.7–4.0)
MCH: 33.1 pg (ref 26.0–34.0)
MCHC: 32.7 g/dL (ref 30.0–36.0)
MCV: 101 fL — ABNORMAL HIGH (ref 78.0–100.0)
MONO ABS: 0.4 10*3/uL (ref 0.1–1.0)
MONOS PCT: 3 %
Neutro Abs: 10.5 10*3/uL — ABNORMAL HIGH (ref 1.7–7.7)
Neutrophils Relative %: 90 %
PLATELETS: 240 10*3/uL (ref 150–400)
RBC: 3.87 MIL/uL (ref 3.87–5.11)
RDW: 13.6 % (ref 11.5–15.5)
WBC: 11.7 10*3/uL — ABNORMAL HIGH (ref 4.0–10.5)

## 2016-04-02 LAB — GLUCOSE, CAPILLARY: GLUCOSE-CAPILLARY: 163 mg/dL — AB (ref 65–99)

## 2016-04-02 LAB — I-STAT TROPONIN, ED: Troponin i, poc: 0.01 ng/mL (ref 0.00–0.08)

## 2016-04-02 LAB — MRSA PCR SCREENING: MRSA by PCR: NEGATIVE

## 2016-04-02 LAB — BRAIN NATRIURETIC PEPTIDE: B Natriuretic Peptide: 1066.2 pg/mL — ABNORMAL HIGH (ref 0.0–100.0)

## 2016-04-02 LAB — CBG MONITORING, ED: Glucose-Capillary: 191 mg/dL — ABNORMAL HIGH (ref 65–99)

## 2016-04-02 MED ORDER — INSULIN ASPART 100 UNIT/ML ~~LOC~~ SOLN
0.0000 [IU] | Freq: Four times a day (QID) | SUBCUTANEOUS | Status: DC
  Administered 2016-04-03 (×2): 1 [IU] via SUBCUTANEOUS
  Administered 2016-04-03: 2 [IU] via SUBCUTANEOUS

## 2016-04-02 MED ORDER — HYDRALAZINE HCL 20 MG/ML IJ SOLN
10.0000 mg | INTRAMUSCULAR | Status: DC | PRN
  Filled 2016-04-02: qty 1

## 2016-04-02 MED ORDER — ONDANSETRON HCL 4 MG/2ML IJ SOLN: 4.0000 mg | Freq: Four times a day (QID) | INTRAMUSCULAR | Status: DC | PRN

## 2016-04-02 MED ORDER — ACETAMINOPHEN 160 MG/5ML PO SOLN: 650.0000 mg | ORAL | Status: DC | PRN

## 2016-04-02 MED ORDER — IPRATROPIUM-ALBUTEROL 0.5-2.5 (3) MG/3ML IN SOLN
3.0000 mL | RESPIRATORY_TRACT | Status: DC | PRN
Start: 2016-04-02 — End: 2016-04-05

## 2016-04-02 MED ORDER — MORPHINE SULFATE (PF) 2 MG/ML IV SOLN
1.0000 mg | Freq: Once | INTRAVENOUS | Status: AC
  Administered 2016-04-02: 1 mg via INTRAVENOUS
  Filled 2016-04-02: qty 1

## 2016-04-02 MED ORDER — NITROGLYCERIN IN D5W 200-5 MCG/ML-% IV SOLN
INTRAVENOUS | Status: AC
  Filled 2016-04-02: qty 250

## 2016-04-02 MED ORDER — ACETAMINOPHEN 325 MG PO TABS: 650.0000 mg | ORAL_TABLET | ORAL | Status: DC | PRN

## 2016-04-02 MED ORDER — SODIUM CHLORIDE 0.9 % IV SOLN
3.0000 g | Freq: Three times a day (TID) | INTRAVENOUS | Status: DC
  Administered 2016-04-02 – 2016-04-03 (×3): 3 g via INTRAVENOUS
  Filled 2016-04-02 (×3): qty 3

## 2016-04-02 MED ORDER — NICARDIPINE HCL IN NACL 20-0.86 MG/200ML-% IV SOLN
INTRAVENOUS | Status: AC
  Filled 2016-04-02: qty 200

## 2016-04-02 MED ORDER — STROKE: EARLY STAGES OF RECOVERY BOOK
Freq: Once | Status: DC
  Filled 2016-04-02: qty 1

## 2016-04-02 MED ORDER — FUROSEMIDE 10 MG/ML IJ SOLN
40.0000 mg | Freq: Two times a day (BID) | INTRAMUSCULAR | Status: DC
  Administered 2016-04-03: 40 mg via INTRAVENOUS
  Filled 2016-04-02: qty 4

## 2016-04-02 MED ORDER — SODIUM CHLORIDE 0.9 % IV SOLN: 1.5000 g | Freq: Once | INTRAVENOUS | Status: DC

## 2016-04-02 MED ORDER — NITROGLYCERIN IN D5W 200-5 MCG/ML-% IV SOLN
0.0000 ug/min | INTRAVENOUS | Status: DC
  Administered 2016-04-02: 60 ug/min via INTRAVENOUS

## 2016-04-02 MED ORDER — ONDANSETRON HCL 4 MG/2ML IJ SOLN
INTRAMUSCULAR | Status: AC
  Administered 2016-04-02: 4 mg
  Filled 2016-04-02: qty 2

## 2016-04-02 MED ORDER — FUROSEMIDE 10 MG/ML IJ SOLN
80.0000 mg | Freq: Once | INTRAMUSCULAR | Status: AC
  Administered 2016-04-02: 80 mg via INTRAVENOUS
  Filled 2016-04-02: qty 8

## 2016-04-02 MED ORDER — LABETALOL HCL 5 MG/ML IV SOLN
INTRAVENOUS | Status: AC
  Filled 2016-04-02: qty 4

## 2016-04-02 MED ORDER — ENOXAPARIN SODIUM 40 MG/0.4ML ~~LOC~~ SOLN
40.0000 mg | SUBCUTANEOUS | Status: DC
  Administered 2016-04-03: 40 mg via SUBCUTANEOUS
  Filled 2016-04-02 (×2): qty 0.4

## 2016-04-02 MED ORDER — LABETALOL HCL 5 MG/ML IV SOLN
20.0000 mg | Freq: Once | INTRAVENOUS | Status: AC
  Administered 2016-04-02: 20 mg via INTRAVENOUS

## 2016-04-02 MED ORDER — ENALAPRILAT 1.25 MG/ML IV SOLN
1.2500 mg | Freq: Once | INTRAVENOUS | Status: DC
  Filled 2016-04-02: qty 1

## 2016-04-02 MED ORDER — ACETAMINOPHEN 650 MG RE SUPP
650.0000 mg | RECTAL | Status: DC | PRN
  Administered 2016-04-03 – 2016-04-04 (×2): 650 mg via RECTAL
  Filled 2016-04-02 (×2): qty 1

## 2016-04-02 NOTE — Progress Notes (Signed)
VASCULAR LAB PRELIMINARY  PRELIMINARY  PRELIMINARY  PRELIMINARY  Carotid duplex completed.    Preliminary report:  1-39% ICA stenosis.  Vertebral artery flow is antegrade. Technically difficult study secondary to rapid, laborious breathing.   Lyly Canizales, RVT 04/02/2016, 6:58 PM

## 2016-04-02 NOTE — ED Notes (Signed)
Pt on bipap, started vomiting, bipap removed, suction her mouth, turned on side. Dr. Clydene Pugh at bedside.

## 2016-04-02 NOTE — Progress Notes (Signed)
Report given to ICU RT.

## 2016-04-02 NOTE — Progress Notes (Signed)
While I was running pt ABG- RN called d/t pt vomit in bipap mask.  Per RN, RN and MD at bedside when pt vomited in mask- mask was removed immediately and MD/RN suctioned pt mouth and airway.  Per MD, pt began to desat.  MD placed pt back on bipap s/p suctioning mouth and airway.  Peep was increased to 8 per MD.  Per MD, pt is DNI/DNR.

## 2016-04-02 NOTE — H&P (Signed)
History and Physical    Kristy Morris TGG:[id number] DOB: 1940-10-08 DOA: 04/02/2016  Referring MD/NP/PA: Dr. Clydene Pugh PCP: Sid Falcon, MD  Patient coming from: Independent living via EMS  Chief Complaint: Nonresponsive  HPI: Kristy Morris is a 76 y.o. female with medical history significant of HTN, CHF, CAD, s/p PM, DM type II; who presents after being found at home in her bed nonresponsive.  Patient was noted to live independently. History is obtained by the daughter as the patient is unable to provide her own history at this tim.  Last reported normal around 7 PM last night when the daughter came to check on her. However, this morning the daughter was unable to get hold of her by phone and became worried. Daughter went to her home and found that the door was locked. When she finally was able to get inside she found her mother lying on the bed nonresponsive with shallow breathing. She immediately call 911. EMS reported elevated blood pressure as high as 200/120 and patient not moving right side.  ED Course: Upon admission into the emergency department patient was seen to be afebrile, pulse 80-118, respirations elevated into the 30s, blood pressure elevated as high as 214/119, and O2 saturations reported as low as 82% on room air. Patient was noted to have vomited initially, but the ED physician was able to suction the patient. Thereafter, she was able to be placed on BiPAP mask with improvement of O2 saturation. Lab work revealed WBC 11.7, BUN 43, creatinine 1.53, glucose 193, and BNP 1066.2. Initial chest x-ray showed cardiac enlargement with mild pulmonary edema.Initially patient was given nitroglycerin initially, but this was discontinued as patient's blood pressure seemed to increase.  Thereafter patient was ordered labetalol 20 mg IV. After lab results and chest x-ray were obtained patient was given 80 mg of Lasix for appeared to be a CHF exacerbation. The CT scan of the brain was able to  be obtained which revealed a acute/subacute left MCA stroke. Neurology was consulted and [facility] was called to admit.  Review of Systems: As per HPI otherwise 10 point review of systems negative.   Past Medical History:  Diagnosis Date  . CHF (congestive heart failure) (HCC)   . Coronary artery disease   . Diabetes mellitus without complication (HCC)   . Hypertension   . Leaky heart valve   . Pacemaker   . Stroke [facility])     Past Surgical History:  Procedure Laterality Date  . ABDOMINAL HYSTERECTOMY    . cardiac stents    . COLONOSCOPY N/A 01/30/2015   Procedure: COLONOSCOPY;  Surgeon: Carman Ching, MD;  Location: [facility] ENDOSCOPY;  Service: Endoscopy;  Laterality: N/A;  . ESOPHAGOGASTRODUODENOSCOPY N/A 01/28/2015   Procedure: ESOPHAGOGASTRODUODENOSCOPY (EGD);  Surgeon: Carman Ching, MD;  Location[facility] ENDOSCOPY;  Service: Endoscopy;  Laterality: N/A;  . PACEMAKER INSERTION       reports that she has never smoked. She does not have any smokeless tobacco history on file. She reports that she drinks alcohol. She reports that she does not use drugs.  No Known Allergies  No family history on file.  Prior to Admission medications   Medication Sig Start Date End Date Taking? Authorizing Provider  ALPRAZolam Prudy Feeler) 0.5 MG tablet Take 0.5 mg by mouth 2 (two) times daily as needed for anxiety.   Yes Historical Provider, MD  amLODipine (NORVASC) 5 MG tablet Take 5 mg by mouth daily. 01/07/16  Yes Historical Provider, MD  Ascorbic Acid (VITAMIN C PO) Take  1 tablet by mouth daily.   Yes Historical Provider, MD  aspirin 81 MG tablet Take 81 mg by mouth daily.   Yes Historical Provider, MD  carvedilol (COREG CR) 80 MG 24 hr capsule Take 80 mg by mouth daily.   Yes Historical Provider, MD  chlorthalidone (HYGROTON) 25 MG tablet Take 25 mg by mouth daily.   Yes Historical Provider, MD  cholecalciferol (VITAMIN D) 1000 UNITS tablet Take 1,000 Units by mouth daily.   Yes Historical Provider, MD    diclofenac sodium (VOLTAREN) 1 % GEL Apply 1 application topically as needed. 06/22/14  Yes Historical Provider, MD  ferrous sulfate (FERROUSUL) 325 (65 FE) MG tablet Take 1 tablet (325 mg total) by mouth 2 (two) times daily with a meal. 01/31/15  Yes [name], MD  Fluticasone-Salmeterol (ADVAIR) 250-50 MCG/DOSE AEPB Inhale 1 puff into the lungs 2 (two) times daily.   Yes Historical Provider, MD  losartan (COZAAR) 100 MG tablet Take 100 mg by mouth daily.   Yes Historical Provider, MD  pantoprazole (PROTONIX) 40 MG tablet Take 40 mg by mouth 2 (two) times daily.   Yes Historical Provider, MD  simvastatin (ZOCOR) 20 MG tablet Take 20 mg by mouth every evening.   Yes Historical Provider, MD  valsartan (DIOVAN) 320 MG tablet Take 320 mg by mouth daily.   Yes Historical Provider, MD  Wheat Dextrin (BENEFIBER PO) Take 5 mLs by mouth daily.   Yes Historical Provider, MD  nitroGLYCERIN (NITROSTAT) 0.4 MG SL tablet Place 0.4 mg under the tongue every 5 (five) minutes as needed for chest pain.    Historical Provider, MD    Physical Exam: Constitutional: Elderly female who is not responsive to verbal stimuli Vitals:   04/02/16 1330 04/02/16 1345 04/02/16 1400 04/02/16 1415  BP: 169/96 (!) 168/104 (!) 172/114 187/96  Pulse: 84 80 102 91  Resp: (!) 37 (!) 38 (!) 35 (!) 41  Temp:      TempSrc:      SpO2: 100% 100% 100% 100%   Eyes: PERRL, lids and conjunctivae normal ENMT: Mucous membranes are moist. Posterior pharynx clear of any exudate or lesions.Normal dentition.  Neck: normal, supple, no masses, no thyromegaly Respiratory: Tachypneic on BiPAP mask.  Cardiovascular: Regular rate and rhythm, no murmurs / rubs / gallops. No extremity edema. 2+ pedal pulses. No carotid bruits.   Abdomen: no tenderness, no masses palpated. No hepatosplenomegaly. Bowel sounds positive.  Musculoskeletal: no clubbing / cyanosis. No joint deformity upper and lower extremities. Good ROM, no contractures. Normal  muscle tone.  Skin: no rashes, lesions, ulcers. No induration Neurologic: Cannot fully assess at this time patient not moving right side. Psychiatric: Altered not responding to verbal stimuli at this time.    Labs on Admission: I have personally reviewed following labs and imaging studies  CBC:  Recent Labs Lab 04/02/16 1216  WBC 11.7*  NEUTROABS 10.5*  HGB 12.8  HCT 39.1  MCV 101.0*  PLT 240   Basic Metabolic Panel:  Recent Labs Lab 04/02/16 1216  NA 140  K 4.7  CL 107  CO2 23  GLUCOSE 193*  BUN 43*  CREATININE 1.53*  CALCIUM 9.9   GFR: CrCl cannot be calculated (Unknown ideal weight.). Liver Function Tests:  Recent Labs Lab 04/02/16 1216  AST 26  ALT 16  ALKPHOS 69  BILITOT 1.4*  PROT 7.3  ALBUMIN 3.8   No results for input(s): LIPASE, AMYLASE in the last 168 hours. No results for input(s): AMMONIA  in the last 168 hours. Coagulation Profile: No results for input(s): INR, PROTIME in the last 168 hours. Cardiac Enzymes: No results for input(s): CKTOTAL, CKMB, CKMBINDEX, TROPONINI in the last 168 hours. BNP (last 3 results) No results for input(s): PROBNP in the last 8760 hours. HbA1C: No results for input(s): HGBA1C in the last 72 hours. CBG:  Recent Labs Lab 04/02/16 1210  GLUCAP 191*   Lipid Profile: No results for input(s): CHOL, HDL, LDLCALC, TRIG, CHOLHDL, LDLDIRECT in the last 72 hours. Thyroid Function Tests: No results for input(s): TSH, T4TOTAL, FREET4, T3FREE, THYROIDAB in the last 72 hours. Anemia Panel: No results for input(s): VITAMINB12, FOLATE, FERRITIN, TIBC, IRON, RETICCTPCT in the last 72 hours. Urine analysis: No results found for: COLORURINE, APPEARANCEUR, LABSPEC, PHURINE, GLUCOSEU, HGBUR, BILIRUBINUR, KETONESUR, PROTEINUR, UROBILINOGEN, NITRITE, LEUKOCYTESUR Sepsis Labs: No results found for this or any previous visit (from the past 240 hour(s)).   Radiological Exams on Admission: Ct Head Wo Contrast  Result Date:  04/02/2016 CLINICAL DATA:  Altered mental status today. EXAM: CT HEAD WITHOUT CONTRAST TECHNIQUE: Contiguous axial images were obtained from the base of the skull through the vertex without intravenous contrast. COMPARISON:  None. FINDINGS: Brain: Abnormal hypoattenuation is seen in the left frontal and parietal lobes, insular cortex and caudate consistent with acute or subacute left MCA territory infarct. No hemorrhage is identified. No midline shift or abnormal extra-axial fluid collection. No hydrocephalus or pneumocephalus. Vascular: No hyperdense vessel is seen.  Atherosclerosis noted. Skull: Intact. Sinuses/Orbits: Negative. Other: None. IMPRESSION: Findings consistent with acute or subacute left MCA infarct. No hemorrhage. Atrophy and chronic microvascular ischemic change. These results were called by telephone at the time of interpretation on 04/02/2016 at 3:02 pm to Dr. Lyndal Pulley , who verbally acknowledged these results. Electronically Signed   By: Drusilla Kanner M.D.   On: 04/02/2016 15:02   Dg Chest Port 1 View  Result Date: 04/02/2016 CLINICAL DATA:  Shortness of breath EXAM: PORTABLE CHEST 1 VIEW COMPARISON:  None. FINDINGS: Left chest wall pacer device is noted with lead in the right ventricle. Moderate cardiac enlargement. Diminished lung volumes. Mild diffuse pulmonary edema identified. No airspace opacities noted. IMPRESSION: 1. Cardiac enlargement and mild pulmonary edema. Electronically Signed   By: Signa Kell M.D.   On: 04/02/2016 12:54    EKG: Independently reviewed. 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 CT scan of the brain shows new left MCA stroke. - Admit to stepdown - Stroke order set initiated - Neuro checks - NPO, until RN swallow study - Check hemoglobin A1c, lipid panel - PT/OT/speech to evaluate and treat - Check  echocardiogram - Appreciate neurology consultative services, will follow-up for further recommendations - Will need to restart home medications as patient able to pass formal swallow study  Acute respiratory failure secondary to CHF exacerbation:  Acute. Patient found to acute respiratory distress requiring BiPAP. Chest x-ray with pulmonary edema and BNP elevated at 1066. Given 80 mg of Lasix IV in the emergency department.   - Continuous pulse oximetry  - Continue BiPAP and wean as tolerated - Strict I&O's and daily weights - Lasix 40 mg IV BID - Reassess in a.m. and adjust diuresis as needed.  - Foley catheter for strict I's and O's   Nausea and vomiting : Patient was found to have one episode of vomiting after initial placement of BiPAP mask. - Empiric antibiotics  of Unasyn for possible aspiration - Zofran prn N/V  Leukocytosis: WBC elevated at 11.7 on admission - Follow-up urinalysis - Repeat CBC in a.m.  Accelerated hypertension - hydralazine IV prn    Acute kidney injury : Patient presents with a creatinine of 1.53 and a BUN of 43. Baseline creatinine appears to be 1.02 to 1.14 looking at records from back in 2016. - Recheck BMP in a.m.  S/p Biotronik PM: - Care order placed for Interrogate of PM  Diabetes mellitus type 2: Patient presents with initial blood glucose of 196. - Hypoglycemic protocols - CBGs every 6 hours with sensitive SSI    DVT prophylaxis: lovenox Code Status: DO NOT RESUSCITATE Family Communication: discuss plan of care with family present at bedside  Disposition Plan: TBD   Consults called: Neurology  Admission status: Inpatient  Clydie Braun MD [facility] Pager [phone]  If 7PM-7AM, please contact night-coverage [email or web address] Password [email or web address]  04/02/2016, 3:00 PM

## 2016-04-02 NOTE — ED Notes (Signed)
Bipap on patient, family at bedside.

## 2016-04-02 NOTE — Consult Note (Addendum)
Referring Physician: Dr Katrinka Blazing    Chief Complaint: Found unresponsive  HPI: Kristy Morris is a 76 y.o. 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 Despite her multiple health issues she was able to live alone. She ambulated with a walker and was able to perform her own self-care and household chores. Her daughter and family who live close by would check on her daily. The patient's daughter reports that she saw the patient the evening prior to admission at approximately 6 PM. At that time she was doing well and was in her normal state of health. Her daughter returned at about 10:30 this morning and found the patient still in bed and unresponsive. She noted shallow respirations and called 911. Shortly after arrival in the emergency department the patient vomited and possibly aspirated. She was placed on BiPAP. A CT of the head without contrast revealed findings consistent with an acute or subacute left MCA infarct without hemorrhage. The patient is unable to have an MRI secondary to her permanent pacemaker. She has been on aspirin 81 mg daily and according to her daughter has been compliant with her medications. Recently her blood pressure has been difficult to control. Her cardiologist is in [facility].  Date last known well: Date: 04/01/2016 Time last known well: Time: 18:00 tPA Given: No: The patient was out of the window for treatment.  Past Medical History:  Diagnosis Date  . CHF (congestive heart failure) (HCC)   . Coronary artery disease   . Diabetes mellitus without complication (HCC)   . Hypertension   . Leaky heart valve   . Pacemaker   . Stroke [facility])     Past Surgical History:  Procedure Laterality Date  . ABDOMINAL HYSTERECTOMY    . cardiac stents    . COLONOSCOPY N/A  01/30/2015   Procedure: COLONOSCOPY;  Surgeon: Carman Ching, MD;  Location: [facility] ENDOSCOPY;  Service: Endoscopy;  Laterality: N/A;  . ESOPHAGOGASTRODUODENOSCOPY N/A 01/28/2015   Procedure: ESOPHAGOGASTRODUODENOSCOPY (EGD);  Surgeon: Carman Ching, MD;  Location: [facility] ENDOSCOPY;  Service: Endoscopy;  Laterality: N/A;  . PACEMAKER INSERTION       Family History: there is a strong family history of coronary artery disease. The patient's mother died from a stroke in her early 18s.  Social History:  reports that she has never smoked. She does not have any smokeless tobacco history on file. She reports that she drinks alcohol. She reports that she does not use drugs.  Allergies: No Known Allergies  Medications:  Current Facility-Administered Medications  Medication Dose Route Frequency Provider Last Rate Last Dose  .  stroke: mapping our early stages of recovery book   Does not apply Once Clydie Braun, MD      . acetaminophen (TYLENOL) tablet 650 mg  650 mg Oral Q4H PRN Clydie Braun, MD       Or  . acetaminophen (TYLENOL) solution 650 mg  650 mg Per Tube Q4H PRN Clydie Braun, MD       Or  . acetaminophen (TYLENOL) suppository 650 mg  650 mg Rectal Q4H PRN Clydie Braun, MD      . enoxaparin (LOVENOX) injection 40 mg  40 mg Subcutaneous Q24H Rondell A Katrinka Blazing, MD      . Melene Muller ON 04/03/2016] furosemide (LASIX) injection 40 mg  40 mg Intravenous Q12H Rondell A  Katrinka Blazing, MD       Current Outpatient Prescriptions  Medication Sig Dispense Refill  . ALPRAZolam (XANAX) 0.5 MG tablet Take 0.5 mg by mouth 2 (two) times daily as needed for anxiety.    Marland Kitchen amLODipine (NORVASC) 5 MG tablet Take 5 mg by mouth daily.    . Ascorbic Acid (VITAMIN C PO) Take 1 tablet by mouth daily.    Marland Kitchen aspirin 81 MG tablet Take 81 mg by mouth daily.    . carvedilol (COREG CR) 80 MG 24 hr capsule Take 80 mg by mouth daily.    . chlorthalidone (HYGROTON) 25 MG tablet Take 25 mg by mouth daily.    . cholecalciferol  (VITAMIN D) 1000 UNITS tablet Take 1,000 Units by mouth daily.    . diclofenac sodium (VOLTAREN) 1 % GEL Apply 1 application topically as needed.    . ferrous sulfate (FERROUSUL) 325 (65 FE) MG tablet Take 1 tablet (325 mg total) by mouth 2 (two) times daily with a meal.  3  . Fluticasone-Salmeterol (ADVAIR) 250-50 MCG/DOSE AEPB Inhale 1 puff into the lungs 2 (two) times daily.    Marland Kitchen losartan (COZAAR) 100 MG tablet Take 100 mg by mouth daily.    . pantoprazole (PROTONIX) 40 MG tablet Take 40 mg by mouth 2 (two) times daily.    . simvastatin (ZOCOR) 20 MG tablet Take 20 mg by mouth every evening.    . valsartan (DIOVAN) 320 MG tablet Take 320 mg by mouth daily.    . Wheat Dextrin (BENEFIBER PO) Take 5 mLs by mouth daily.    . nitroGLYCERIN (NITROSTAT) 0.4 MG SL tablet Place 0.4 mg under the tongue every 5 (five) minutes as needed for chest pain.       ROS: Not obtainable from the patient at this time. The patient's daughter reports that the patient seemed to be more short of breath than usual over the past week. She also had some increased lower extremity edema. There was no mention of chest pain. She is very hard of hearing.   Physical Examination: Blood pressure 186/100, pulse 81, temperature 97.6 F (36.4 C), temperature source Axillary, resp. rate (!) 34, SpO2 100 %. General - 76 year old obese female with BiPAP in place who is for the most part unresponsive. Heart - Regular rate and rhythm Lungs - rhonchi throughout Abdomen - Obese - soft - non tender Extremities - Distal pulses intact - 1-2+ pitting edema bilaterally edema Skin - Cool and dry  Mental Status: The patient does not respond to questions and only followed a command to squeeze fingers once with her left upper extremity. Cranial Nerves: II: Discs not visualized; Visual fields could not be tested, pupils equal, round,  III,IV, VI: ptosis not present, the eyes are deviated to the left. V,VII: Could not be tested VIII:  Could not be tested IX,X: Gag reflex not tested XI: Unable to test XII: The patient was unable to extend her tongue. Motor: The patient was unable to follow commands to move her extremities; although, she was able to hold her left upper extremity off the bed, unsupported, once positioned. Increased tone noted in the right upper extremity Sensory: The patient responds to minimal painful stimuli Deep Tendon Reflexes: 2+ and symmetric throughout Plantars: Right: Upgoing   Left: downgoing Cerebellar: Unable to test Gait: Unable to test CV: pulses palpable throughout   Laboratory Studies:  Basic Metabolic Panel:  Recent Labs Lab 04/02/16 1216  NA 140  K 4.7  CL 107  CO2 23  GLUCOSE 193*  BUN 43*  CREATININE 1.53*  CALCIUM 9.9    Liver Function Tests:  Recent Labs Lab 04/02/16 1216  AST 26  ALT 16  ALKPHOS 69  BILITOT 1.4*  PROT 7.3  ALBUMIN 3.8   No results for input(s): LIPASE, AMYLASE in the last 168 hours. No results for input(s): AMMONIA in the last 168 hours.  CBC:  Recent Labs Lab 04/02/16 1216  WBC 11.7*  NEUTROABS 10.5*  HGB 12.8  HCT 39.1  MCV 101.0*  PLT 240    Cardiac Enzymes: No results for input(s): CKTOTAL, CKMB, CKMBINDEX, TROPONINI in the last 168 hours.  BNP: Invalid input(s): POCBNP  CBG:  Recent Labs Lab 04/02/16 1210  GLUCAP 191*    Microbiology: Results for orders placed or performed during the hospital encounter of 01/27/15  MRSA PCR Screening     Status: None   Collection Time: 01/27/15 11:24 PM  Result Value Ref Range Status   MRSA by PCR NEGATIVE NEGATIVE Final    Comment:        The GeneXpert MRSA Assay (FDA approved for NASAL specimens only), is one component of a comprehensive MRSA colonization surveillance program. It is not intended to diagnose MRSA infection nor to guide or monitor treatment for MRSA infections.     Coagulation Studies: No results for input(s): LABPROT, INR in the last 72  hours.  Urinalysis: No results for input(s): COLORURINE, LABSPEC, PHURINE, GLUCOSEU, HGBUR, BILIRUBINUR, KETONESUR, PROTEINUR, UROBILINOGEN, NITRITE, LEUKOCYTESUR in the last 168 hours.  Invalid input(s): APPERANCEUR  Lipid Panel: No results found for: CHOL, TRIG, HDL, CHOLHDL, VLDL, LDLCALC  HgbA1C: No results found for: HGBA1C  Urine Drug Screen:  No results found for: LABOPIA, COCAINSCRNUR, LABBENZ, AMPHETMU, THCU, LABBARB  Alcohol Level: No results for input(s): ETH in the last 168 hours.  Other results: EKG: Paced rhythm rate 98 bpm  Imaging:  Ct Head Wo Contrast 04/02/2016 Findings consistent with acute or subacute left MCA infarct. No hemorrhage. Atrophy and chronic microvascular ischemic change.    Dg Chest Port 1 View 04/02/2016 1. Cardiac enlargement and mild pulmonary edema.    Assessment: 76 y.o. 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   Stroke Risk Factors - diabetes mellitus, family history of strokes, coronary artery disease, hypertension, a previous stroke, and obesity.  Plan: 1. HgbA1c, fasting lipid panel 2. MRI, MRA  could not be performed secondary to a permanent pacemaker. 3. PT consult, OT consult, Speech consult 4. Echocardiogram 5. Carotid dopplers 6. Prophylactic therapy- aspirin suppository 300 mg daily. 7. NPO until RN stroke swallow screen 8. Telemetry monitoring 9. Frequent neuro checks 10. Consider pacemaker interrogation   Further assessment and plan to follow per Dr. Roxy Manns.  Delton See PA-C [facility] Pager [phone] 04/02/2016, 4:19 PM  Neurology attending addendum:  This patient was seen,  examined, and d/w PA. I have reviewed the note and agree with the findings, assessment and plan as documented with the following additions.   In brief, this is a 76 year old woman with previous history of stroke resulting in right sided weakness with minimal residual who now presents after being found down by her daughter earlier today. She was last seen normal at approximately 1900 on 04/01/16. Her  daughter tried calling this morning when she didn't answer she went to check on her at around 10:30 this morning. She found the doors locked which was unusual. When she went inside, she found the patient in bed and unresponsive. EMS was activated and the patient was brought to the emergency department. She was initially noted to be in respiratory distress and was placed on BiPAP. She had some vomiting with concern for possible aspiration. She was taken for a CT scan of the head which revealed a subacute left MCA territory infarction. Neurology consultation is now requested.  Exam: General: This is a well-developed Caucasian woman lying in the gurney. BiPAP in place. She will moan intermittently but makes no attempts to verbalize. She did not follow any commands for me. She keeps her eyes closed. Cardiovascular: Regular, no obvious murmur. Heart sounds are somewhat obscured by overlying breath sounds. No obvious carotid bruits. Lungs: She is tachypnea, wearing BiPAP. Upper airway sounds are somewhat coarse. Neuro: Cranial nerves: She has mild incomplete left gaze deviation. Pupils are symmetric and reactive from 3-2 millimeters bilaterally. The right corneal is diminished. She has some flattening of the right nasolabial fold with asymmetric grimace to pain. The remainder of her cranial nerve examination is limited by poor cooperation. Motor: She has increased tone in both upper extremities, right more than left. She is moving her left arm spontaneously and purposefully. No significant movement of the right arm  was observed. Tone appears to be normal in both lower extremities. No abnormal movements are appreciated. Sensation: She withdraws the left better than the right from noxious stimuli. Minimal withdrawal was seen in the right upper extremity though she does groan to nailbed pressure in that limb. DTRs: 3+ on the right, 2+ on the left. The right toe is mute, the left toe downgoing. Coordination and gait: This cannot be assessed as the patient is unable to participate.  Imaging: I have personally and independently reviewed CT scan of the head without contrast. This shows large area of hypodensity involving the anterior left MCA territory consistent with subacute ischemic infarction. There appears to be relative hypodensity in the adjacent left MCA suggesting probable thrombus in that vessel. There is regional mass effect from this infarct most noted upon the frontal horn of the left lateral ventricle. There is likely some petechial hemorrhagic conversion of this infarct. No midline shift is appreciated. She has moderate chronic small ischemic disease in the bihemispheric white matter. Moderate diffuse generalized atrophy is present.  Pertinent labs: CBC notable for white blood cell count 11.7, MCV 101 CMP notable for glucose 193, BUN 43, creatinine 1.53 BNP 1066 Troponin 0.01 ABG shows pH 7.328, PCO2 40.7, PO2 201, bicarbonate 21.3 Urinalysis shows small hemoglobin, protein 30  Impression: 1. Subacute ischemic stroke, left MCA territory 2. Expressive aphasia 3. Right hemiparesis 4. Acute encephalopathy  Recommendations: As per PA note. She will be admitted for stroke evaluation. Of note, she was presently taking aspirin for secondary stroke prevention after having had complications with both warfarin and Plavix in the past.  I discussed the significance of the patient's stroke and the expected deficits with the patient's family at the bedside, including daughter, son-in-law, and grandson. I  reviewed the CT scan with them on PACS and discussed the pertinent pathophysiology of her stroke. I discussed the possible risk for cerebral edema in the coming days. I discussed that she is likely to require significant rehabilitation once she pulls through the acute phase. They were given the opportunity  to ask questions and these were addressed to the best of my ability.  The patient's daughter states that she is DO NOT RESUSCITATE/DO NOT INTUBATE.  Thank you for this consult. Neurology will continue to follow. The stroke team will assume care of the patient beginning to/25/18.

## 2016-04-02 NOTE — ED Provider Notes (Signed)
[facility]-EMERGENCY DEPT Provider Note   CSN: [id number] Arrival date & time: 04/02/16  1156     History   Chief Complaint Chief Complaint  Patient presents with  . Altered Mental Status    HPI Kristy Morris is a 76 y.o. female.  The history is provided by a relative.  Altered Mental Status   This is a new problem. Episode onset: unknown, last seen normal last night in independent living. The problem has not changed since onset.Associated symptoms include confusion and somnolence. Past medical history comments: CHF.  Shortness of Breath  This is a new problem. Duration: overnight. The problem has been rapidly worsening. Pertinent negatives include no fever, no cough and no abdominal pain.    Past Medical History:  Diagnosis Date  . CHF (congestive heart failure) (HCC)   . Coronary artery disease   . Diabetes mellitus without complication (HCC)   . Hypertension   . Leaky heart valve   . Pacemaker   . Stroke [facility])     Patient Active Problem List   Diagnosis Date Noted  . Acute blood loss anemia 01/28/2015  . Essential hypertension 01/28/2015  . Morbid obesity (HCC) 01/28/2015  . GI bleeding 01/27/2015  . Rectal bleeding     Past Surgical History:  Procedure Laterality Date  . ABDOMINAL HYSTERECTOMY    . cardiac stents    . COLONOSCOPY N/A 01/30/2015   Procedure: COLONOSCOPY;  Surgeon: Carman Ching, MD;  Location: [facility] ENDOSCOPY;  Service: Endoscopy;  Laterality: N/A;  . ESOPHAGOGASTRODUODENOSCOPY N/A 01/28/2015   Procedure: ESOPHAGOGASTRODUODENOSCOPY (EGD);  Surgeon: Carman Ching, MD;  Location: [facility] ENDOSCOPY;  Service: Endoscopy;  Laterality: N/A;  . PACEMAKER INSERTION      OB History    No data available       Home Medications    Prior to Admission medications   Medication Sig Start Date End Date Taking? Authorizing Provider  ALPRAZolam Prudy Feeler) 0.5 MG tablet Take 0.5 mg by mouth 2 (two) times daily as needed for anxiety.    Historical Provider, MD    Ascorbic Acid (VITAMIN C PO) Take 1 tablet by mouth daily.    Historical Provider, MD  aspirin 81 MG tablet Take 81 mg by mouth daily.    Historical Provider, MD  carvedilol (COREG CR) 80 MG 24 hr capsule Take 80 mg by mouth daily.    Historical Provider, MD  cholecalciferol (VITAMIN D) 1000 UNITS tablet Take 1,000 Units by mouth daily.    Historical Provider, MD  ferrous sulfate (FERROUSUL) 325 (65 FE) MG tablet Take 1 tablet (325 mg total) by mouth 2 (two) times daily with a meal. 01/31/15   [name], MD  Fluticasone-Salmeterol (ADVAIR) 250-50 MCG/DOSE AEPB Inhale 1 puff into the lungs 2 (two) times daily.    Historical Provider, MD  nitroGLYCERIN (NITROSTAT) 0.4 MG SL tablet Place 0.4 mg under the tongue every 5 (five) minutes as needed for chest pain.    Historical Provider, MD  pantoprazole (PROTONIX) 40 MG tablet Take 40 mg by mouth 2 (two) times daily.    Historical Provider, MD  rosuvastatin (CRESTOR) 20 MG tablet Take 20 mg by mouth daily.    Historical Provider, MD  torsemide (DEMADEX) 5 MG tablet Take 5 mg by mouth 2 (two) times daily.    Historical Provider, MD  Wheat Dextrin (BENEFIBER PO) Take 5 mLs by mouth daily.    Historical Provider, MD    Family History No family history on file.  Social History Social  History  Substance Use Topics  . Smoking status: Never Smoker  . Smokeless tobacco: Not on file  . Alcohol use Yes     Comment: weekly     Allergies   Patient has no known allergies.   Review of Systems Review of Systems  Constitutional: Negative for fever.  Respiratory: Positive for shortness of breath. Negative for cough.   Gastrointestinal: Negative for abdominal pain.  Psychiatric/Behavioral: Positive for confusion.  All other systems reviewed and are negative.    Physical Exam Updated Vital Signs BP (!) 206/103 (BP Location: Right Arm)   Pulse 82   Temp 97.6 F (36.4 C) (Axillary)   Resp 20   SpO2 100%   Physical Exam   Constitutional: She appears lethargic. She appears toxic. She appears distressed.  HENT:  Head: Normocephalic.  Nose: Nose normal.  Eyes: Conjunctivae are normal.  Neck: Neck supple. No tracheal deviation present.  Cardiovascular: Normal rate and regular rhythm.   Pulmonary/Chest: Accessory muscle usage present. Tachypnea noted. She is in respiratory distress. She has rhonchi. She has rales.  Coarse bilateral breath sounds  Abdominal: Soft. She exhibits no distension.  Neurological: She appears lethargic. She is disoriented. GCS eye subscore is 3. GCS verbal subscore is 3. GCS motor subscore is 3.  Bilateral UE paresis and flexion posturing  Skin: Skin is warm and dry.  Psychiatric: She has a normal mood and affect.     ED Treatments / Results  Labs (all labs ordered are listed, but only abnormal results are displayed) Labs Reviewed  CBC WITH DIFFERENTIAL/PLATELET - Abnormal; Notable for the following:       Result Value   WBC 11.7 (*)    MCV 101.0 (*)    Neutro Abs 10.5 (*)    All other components within normal limits  COMPREHENSIVE METABOLIC PANEL - Abnormal; Notable for the following:    Glucose, Bld 193 (*)    BUN 43 (*)    Creatinine, Ser 1.53 (*)    Total Bilirubin 1.4 (*)    GFR calc non Af Amer 32 (*)    GFR calc Af Amer 38 (*)    All other components within normal limits  URINALYSIS, ROUTINE W REFLEX MICROSCOPIC - Abnormal; Notable for the following:    APPearance HAZY (*)    Hgb urine dipstick SMALL (*)    Protein, ur 30 (*)    Bacteria, UA RARE (*)    All other components within normal limits  BRAIN NATRIURETIC PEPTIDE - Abnormal; Notable for the following:    B Natriuretic Peptide 1,066.2 (*)    All other components within normal limits  CBG MONITORING, ED - Abnormal; Notable for the following:    Glucose-Capillary 191 (*)    All other components within normal limits  I-STAT ARTERIAL BLOOD GAS, ED - Abnormal; Notable for the following:    pH, Arterial  7.328 (*)    pO2, Arterial 201.0 (*)    Acid-base deficit 4.0 (*)    All other components within normal limits  MRSA PCR SCREENING  HEMOGLOBIN A1C  LIPID PANEL  CBC WITH DIFFERENTIAL/PLATELET  BASIC METABOLIC PANEL  I-STAT TROPOININ, ED    EKG  EKG Interpretation  Date/Time:  Saturday April 02 2016 12:14:47 EST Ventricular Rate:  98 PR Interval:    QRS Duration: 158 QT Interval:  420 QTC Calculation: 537 R Axis:   -72 Text Interpretation:  Ventricular-paced rhythm Abnormal ECG No significant change since last tracing Confirmed by Swati Granberry MD, Taygan Connell [id number])  on 04/02/2016 12:48:47 PM       Radiology Ct Head Wo Contrast  Result Date: 04/02/2016 CLINICAL DATA:  Altered mental status today. EXAM: CT HEAD WITHOUT CONTRAST TECHNIQUE: Contiguous axial images were obtained from the base of the skull through the vertex without intravenous contrast. COMPARISON:  None. FINDINGS: Brain: Abnormal hypoattenuation is seen in the left frontal and parietal lobes, insular cortex and caudate consistent with acute or subacute left MCA territory infarct. No hemorrhage is identified. No midline shift or abnormal extra-axial fluid collection. No hydrocephalus or pneumocephalus. Vascular: No hyperdense vessel is seen.  Atherosclerosis noted. Skull: Intact. Sinuses/Orbits: Negative. Other: None. IMPRESSION: Findings consistent with acute or subacute left MCA infarct. No hemorrhage. Atrophy and chronic microvascular ischemic change. These results were called by telephone at the time of interpretation on 04/02/2016 at 3:02 pm to Dr. Lyndal Pulley , who verbally acknowledged these results. Electronically Signed   By: Drusilla Kanner M.D.   On: 04/02/2016 15:02   Dg Chest Port 1 View  Result Date: 04/02/2016 CLINICAL DATA:  Shortness of breath EXAM: PORTABLE CHEST 1 VIEW COMPARISON:  None. FINDINGS: Left chest wall pacer device is noted with lead in the right ventricle. Moderate cardiac enlargement. Diminished  lung volumes. Mild diffuse pulmonary edema identified. No airspace opacities noted. IMPRESSION: 1. Cardiac enlargement and mild pulmonary edema. Electronically Signed   By: Signa Kell M.D.   On: 04/02/2016 12:54    Procedures Procedures (including critical care time)  CRITICAL CARE Performed by: Lyndal Pulley Total critical care time: 75 minutes Critical care time was exclusive of separately billable procedures and treating other patients. Critical care was necessary to treat or prevent imminent or life-threatening deterioration. Critical care was time spent personally by me on the following activities: development of treatment plan with patient and/or surrogate as well as nursing, discussions with consultants, evaluation of patient's response to treatment, examination of patient, obtaining history from patient or surrogate, ordering and performing treatments and interventions, ordering and review of laboratory studies, ordering and review of radiographic studies, pulse oximetry and re-evaluation of patient's condition.  Medications Ordered in ED Medications  nitroGLYCERIN 50 mg in dextrose 5 % 250 mL (0.2 mg/mL) infusion (not administered)     Initial Impression / Assessment and Plan / ED Course  I have reviewed the triage vital signs and the nursing notes.  Pertinent labs & imaging results that were available during my care of the patient were reviewed by me and considered in my medical decision making (see chart for details).     76 y.o. female presents with acute mental status change this morning after last known normal last night. Appears to be in acute heart failure exacerbation with pulmonary edema and hypertensive crisis with respiratory distress on arrival. Paradoxical BP reaction to NTG drip with progressive hypertension. This was stopped and improved with labetalol. Has continued hypoxemic respiratory failure. Vomited into BiPap and hypoxemia developed again after presumed  aspiration. Pt is DNR/DNI per family.   CT head performed as no improvement of mental status with respiratory stabilization and shows acute stroke likely developing overnight. Hospitalist was consulted for admission and will see the patient in the emergency department. Neurology consulted to follow.    Final Clinical Impressions(s) / ED Diagnoses   Final diagnoses:  Shortness of breath  Acute congestive heart failure, unspecified congestive heart failure type (HCC)  Acute respiratory failure with hypoxemia (HCC)  Hypertensive crisis  Acute encephalopathy  Acute ischemic stroke (HCC)    New  Prescriptions New Prescriptions   No medications on file     Lyndal Pulley, MD 04/02/16 2018

## 2016-04-02 NOTE — ED Triage Notes (Addendum)
Per ems- pt comes from home was LSN last night at 7 pm by daughter. This morning her daughter couldn't get her to answer phone, went over to her independent living home and found her unresponsive on bed. BP 200/120, pt will squeeze left hand, no movement in right. Has hx of CVA. Rales in her lungs, ems not able to be put on CPAP due to not alert enough. Initial sat 82% RA, came up to 98% on NRB. R hand 20 gauge IV. CBG 255.

## 2016-04-02 NOTE — ED Notes (Signed)
CBG 191 

## 2016-04-02 NOTE — ED Notes (Signed)
Spoke with neurologist reports pt can go to SDU. BP goal systolic < 220.

## 2016-04-02 NOTE — Progress Notes (Signed)
Pt transported to/from CT on bipap/vent w/ no apparent complications.

## 2016-04-02 NOTE — Progress Notes (Signed)
Informed Dr. Katrinka Blazing that patient is breathing at 40 times per minute on bipap and is very tense. MD ordered 1mg  IV morphine once and to place patient on HFNC with a breathing treatment.

## 2016-04-03 ENCOUNTER — Inpatient Hospital Stay (HOSPITAL_COMMUNITY): Payer: Medicare Other

## 2016-04-03 DIAGNOSIS — I340 Nonrheumatic mitral (valve) insufficiency: Secondary | ICD-10-CM

## 2016-04-03 LAB — GLUCOSE, CAPILLARY
GLUCOSE-CAPILLARY: 119 mg/dL — AB (ref 65–99)
GLUCOSE-CAPILLARY: 119 mg/dL — AB (ref 65–99)
Glucose-Capillary: 130 mg/dL — ABNORMAL HIGH (ref 65–99)

## 2016-04-03 LAB — BASIC METABOLIC PANEL
ANION GAP: 12 (ref 5–15)
BUN: 52 mg/dL — ABNORMAL HIGH (ref 6–20)
CHLORIDE: 105 mmol/L (ref 101–111)
CO2: 25 mmol/L (ref 22–32)
Calcium: 9.2 mg/dL (ref 8.9–10.3)
Creatinine, Ser: 2.12 mg/dL — ABNORMAL HIGH (ref 0.44–1.00)
GFR, EST AFRICAN AMERICAN: 25 mL/min — AB (ref >60)
GFR, EST NON AFRICAN AMERICAN: 22 mL/min — AB (ref >60)
Glucose, Bld: 127 mg/dL — ABNORMAL HIGH (ref 65–99)
POTASSIUM: 4.1 mmol/L (ref 3.5–5.1)
SODIUM: 142 mmol/L (ref 135–145)

## 2016-04-03 LAB — CBC WITH DIFFERENTIAL/PLATELET
BASOS ABS: 0 10*3/uL (ref 0.0–0.1)
Basophils Relative: 0 %
EOS ABS: 0 10*3/uL (ref 0.0–0.7)
EOS PCT: 0 %
HCT: 40.1 % (ref 36.0–46.0)
HEMOGLOBIN: 12.4 g/dL (ref 12.0–15.0)
LYMPHS ABS: 1 10*3/uL (ref 0.7–4.0)
Lymphocytes Relative: 8 %
MCH: 31.1 pg (ref 26.0–34.0)
MCHC: 30.9 g/dL (ref 30.0–36.0)
MCV: 100.5 fL — ABNORMAL HIGH (ref 78.0–100.0)
Monocytes Absolute: 0.5 10*3/uL (ref 0.1–1.0)
Monocytes Relative: 4 %
NEUTROS PCT: 88 %
Neutro Abs: 11.1 10*3/uL — ABNORMAL HIGH (ref 1.7–7.7)
PLATELETS: 181 10*3/uL (ref 150–400)
RBC: 3.99 MIL/uL (ref 3.87–5.11)
RDW: 13.8 % (ref 11.5–15.5)
WBC: 12.6 10*3/uL — AB (ref 4.0–10.5)

## 2016-04-03 LAB — ECHOCARDIOGRAM COMPLETE
HEIGHTINCHES: 58 in
Weight: 3037.06 oz

## 2016-04-03 LAB — LIPID PANEL
CHOL/HDL RATIO: 2.7 ratio
Cholesterol: 154 mg/dL (ref 0–200)
HDL: 57 mg/dL (ref >40)
LDL CALC: 88 mg/dL (ref 0–99)
Triglycerides: 43 mg/dL (ref <150)
VLDL: 9 mg/dL (ref 0–40)

## 2016-04-03 MED ORDER — ASPIRIN 300 MG RE SUPP
300.0000 mg | Freq: Every day | RECTAL | Status: DC
Start: 2016-04-03 — End: 2016-04-04
  Administered 2016-04-03 – 2016-04-04 (×2): 300 mg via RECTAL
  Filled 2016-04-03 (×2): qty 1

## 2016-04-03 MED ORDER — MORPHINE SULFATE (PF) 2 MG/ML IV SOLN: 1.0000 mg | Freq: Four times a day (QID) | INTRAVENOUS | Status: DC | PRN

## 2016-04-03 MED ORDER — SODIUM CHLORIDE 0.9 % IV SOLN
INTRAVENOUS | Status: DC
  Administered 2016-04-03: 15:00:00 via INTRAVENOUS

## 2016-04-03 MED ORDER — SODIUM CHLORIDE 0.9 % IV SOLN
3.0000 g | Freq: Two times a day (BID) | INTRAVENOUS | Status: DC
  Administered 2016-04-03 – 2016-04-04 (×2): 3 g via INTRAVENOUS
  Filled 2016-04-03 (×3): qty 3

## 2016-04-03 MED ORDER — FUROSEMIDE 10 MG/ML IJ SOLN: 40.0000 mg | Freq: Every day | INTRAMUSCULAR | Status: DC

## 2016-04-03 MED ORDER — ENOXAPARIN SODIUM 30 MG/0.3ML ~~LOC~~ SOLN
30.0000 mg | SUBCUTANEOUS | Status: DC
Start: 2016-04-03 — End: 2016-04-04
  Administered 2016-04-03: 30 mg via SUBCUTANEOUS
  Filled 2016-04-03: qty 0.3

## 2016-04-03 NOTE — Evaluation (Addendum)
Physical Therapy Evaluation Patient Details Name: Kristy Morris MRN: [id number] DOB: January 11, 1941 Today's Date: 04/03/2016   History of Present Illness  Machele Deihl is a 76 y.o. female with medical history significant of HTN, CHF, CAD, s/p PM, DM type II; who presents after being found at home in her bed nonresponsive. 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 Pt currently with functional limitations due to the deficits listed below (see PT Problem List). Presents with functional deficits, R hemiplegia, R inattention, global aphasia; She does show signs of purposeful movement, and will make occasional brief eye contact; Pt's daughter and POA will likely be faced with tough decisions, and noted Palliative Care Team is consulted to help with those decisions;  Pt will benefit from skilled PT to increase their independence and safety with mobility and decr caregiver burden, and  to facilitate discharge to the venue listed below.       Follow Up Recommendations SNF;Supervision/Assistance - 24 hour    Equipment Recommendations  Other (comment) (to be determined at post-acute rehab)    Recommendations for Other Services OT consult;Speech consult     Precautions / Restrictions Precautions Precautions: Fall      Mobility  Bed Mobility Overal bed mobility: Needs Assistance Bed Mobility: Rolling;Sidelying to Sit;Sit to Sidelying Rolling: Max assist Sidelying to sit: +2 for physical assistance;Total assist     Sit to sidelying: +2 for physical assistance;Total assist General bed mobility comments: max assist and use of bed pad to roll R and L; Total assist to clear feet from EOB and elevate trunk to sitting; Sat EOB approx 10 minutes; Total assist to return to supine and position in bed; bed in semi-chair position, UEs supported heels floated  Transfers                    Ambulation/Gait                 Stairs            Wheelchair Mobility    Modified Rankin (Stroke Patients Only)     04/03/16 1800  Modified Rankin (Stroke Patients Only)  Pre-Morbid Rankin Score 1  Modified Rankin 5       Balance Overall balance assessment: Needs assistance Sitting-balance support: Bilateral upper extremity supported Sitting balance-Leahy Scale: Zero Sitting balance - Comments: Sat EOB for approx 10 minutes with level of assist fluctuating min to max assist; worked on upright sitting, gentle weight shifts, and attending to stimulus in front of pt and to her R side; Family members sat directly in front of Ms. Germano (knees to knees); faciliated weight shift forward to lean toward family member with AAROM Bil UEs for "assisted hugging"; noted incr trunk activation with effortful and purposeful movements with family members Postural control: Posterior lean                                   Pertinent Vitals/Pain Pain Assessment: Faces Faces Pain Scale: Hurts little more Pain Location: Generalized grimace and vocalization with movement Pain Descriptors / Indicators: Grimacing Pain Intervention(s): Monitored during session;Other (comment) (monitored vitals and facial expression)    Home Living Family/patient expects to be discharged to:: Skilled nursing facility                      Prior Function Level of Independence: Independent  with assistive device(s)         Comments: PTA, walked with RW; managed ADLs independently; daughter visits pt daily     Hand Dominance   Dominant Hand: Right    Extremity/Trunk Assessment   Upper Extremity Assessment Upper Extremity Assessment: Defer to OT evaluation    Lower Extremity Assessment Lower Extremity Assessment: RLE deficits/detail;LLE deficits/detail RLE Deficits / Details: Noting incr tone/resistence to passive ROM into hip and knee flexion in supine; full ankle ROM noted, no resistence to PROM RLE  Coordination: decreased fine motor;decreased gross motor LLE Deficits / Details: Generally weak; Noted muscle activation and initiation of hip and knee flexion in supine, Max assist to move through whole range; Tactile cues to initiate; difficult to assess due to decr ability/incr time to follow commands       Communication   Communication: Receptive difficulties;Expressive difficulties  Cognition Arousal/Alertness: Awake/alert Behavior During Therapy: WFL for tasks assessed/performed Overall Cognitive Status: Impaired/Different from baseline Area of Impairment: Attention;Following commands;Problem solving   Current Attention Level: Sustained (R inattention)   Following Commands: Follows one step commands inconsistently;Follows one step commands with increased time     Problem Solving: Slow processing;Difficulty sequencing;Requires tactile cues General Comments: Noted trunk muscle activation with effort to stick tongue out at her close friend sitting in front of her; performed L hand squeeze with incr time on command    General Comments General comments (skin integrity, edema, etc.): Vitals monitored entire session and stable    Exercises     Assessment/Plan    PT Assessment Patient needs continued PT services  PT Problem List Decreased strength;Decreased range of motion;Decreased activity tolerance;Decreased balance;Decreased mobility;Decreased coordination;Decreased cognition;Decreased knowledge of use of DME;Decreased safety awareness;Decreased knowledge of precautions       PT Treatment Interventions DME instruction;Gait training;Functional mobility training;Therapeutic activities;Therapeutic exercise;Balance training;Neuromuscular re-education;Cognitive remediation;Patient/family education    PT Goals (Current goals can be found in the Care Plan section)  Acute Rehab PT Goals Patient Stated Goal: Unable to state PT Goal Formulation: With family Time For Goal Achievement:  04/17/16 Potential to Achieve Goals: Fair    Frequency Min 4X/week   Barriers to discharge        Co-evaluation               End of Session Equipment Utilized During Treatment:  (Bed pad) Activity Tolerance: Patient tolerated treatment well;Other (comment) (quite fatigued at end of session) Patient left: in bed;with call bell/phone within reach;with family/visitor present (bed in semichair position) Nurse Communication: Mobility status PT Visit Diagnosis: Hemiplegia and hemiparesis Hemiplegia - Right/Left: Right Hemiplegia - dominant/non-dominant: Dominant Hemiplegia - caused by: Cerebral infarction         Time: 8416-6063 PT Time Calculation (min) (ACUTE ONLY): 34 min   Charges:   PT Evaluation $PT Eval High Complexity: 1 Procedure PT Treatments $Therapeutic Activity: 8-22 mins   PT G CodesLevi Aland 04/03/2016, 6:21 PM  Van Clines, PT  Acute Rehabilitation Services Pager [phone] Office [phone]

## 2016-04-03 NOTE — Progress Notes (Signed)
PROGRESS NOTE    Kristy Morris  KYH:[id number] DOB: [birth year] DOA: 04/02/2016 PCP: Sid Falcon, MD   Brief Narrative:  76 y.o.femalewith a history of congestive heart failure, coronary artery disease, previous cardiacstents, permanent pacemaker, valvular heart disease, kidney disease, hypertension, obesity, and a previous stroke in April 2016 with residual right-sided weakness. 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 CT scan of the brain shows new left MCA stroke. - Admit to stepdown, CT head is done but unable to get MRI brain due to her pacemaker. I have spoke with Dr Gala Romney, who will get her pacemaker interrogated tomorrow morning.  - Stroke order set initiated - Neuro checks - NPO, meanwhile she can get rectal asa.  - Check hemoglobin A1c, lipid panel - PT/OT/speech to evaluate and treat - Echo done- normal ef with pulm htn.  - Appreciate neurology consultative services, will follow-up for further recommendations - Will need to restart home medications as patient able to pass formal swallow study -I have spoke with Palliative care as well, who will see the patient and her daughter. Will need to determine long term goals of care.   Acute respiratory failure secondary to dCHF exacerbation: improved - Strict I&O's and daily weights - lasix on hold due to elevated Cr - Foley catheter for strict I's and O's   Nausea and vomiting : improved - Empiric antibiotics of Unasyn for possible aspiration - Zofran prn N/V  Leukocytosis, likely  reactive:  -no signs of infection, will monitor for now.   Accelerated hypertension - resolved   Acute kidney injury :  - cont to monitor for now, lasix on held. Will give gentle hydration at this time.  - Recheck BMP in a.m.  S/p Biotronik PM: - spoke with Dr Clarise Cruz to have her pacemaker evaluated.   Diabetes mellitus type 2: Patient presents with initial blood glucose of 196. - Hypoglycemic protocols - CBGs every 6 hours with sensitive SSI    DVT prophylaxis: lovenox Code Status: DO NOT RESUSCITATE Family Communication: discuss plan of care with family present at bedside  Disposition Plan: TBD   Consults called: Neurology  Admission status: Inpatient   Consultants:   Neuro    Subjective: Still remains nonverbal. No other complaints.   Objective: Vitals:   04/03/16 1000 04/03/16 1100 04/03/16 1200 04/03/16 1300  BP: (!) 142/90 134/83 136/82 (!) 147/79  Pulse: (!) 102 98 82 81  Resp: (!) 32 (!) 27 (!) 25 (!) 28  Temp:   97.8 F (36.6 C)   TempSrc:   Oral   SpO2: 100% 100% 100% 98%  Weight:      Height:        Intake/Output Summary (Last 24 hours) at 04/03/16 1417 Last data filed at 04/03/16 1200  Gross per 24 hour  Intake              100 ml  Output             1250 ml  Net            -  1150 ml   Filed Weights   04/02/16 1735 04/03/16 0400  Weight: 86.7 kg (191 lb 2.2 oz) 86.1 kg (189 lb 13.1 oz)    Examination:  General exam: Appears calm  Respiratory system: Clear to auscultation. Respiratory effort normal. Cardiovascular system: S1 & S2 heard, RRR. No JVD, murmurs, rubs, gallops or clicks. No pedal edema. Gastrointestinal system: Abdomen is nondistended, soft and nontender. No organomegaly or masses felt. Normal bowel sounds heard. Central nervous system: awake, opens her eyes but non verbal. Unable to perform neuro exam. Right sided facial droop. Not blinking when shine light in her eyes but her pupils are reactive.  Extremities: unable  to assess but no edema noted.  Skin: No rashes, lesions or ulcers Psychiatry: unaBLE TO ASSESS     Data Reviewed:   CBC:  Recent Labs Lab 04/02/16 1216 04/03/16 0318  WBC 11.7* 12.6*  NEUTROABS 10.5* 11.1*  HGB 12.8 12.4  HCT 39.1 40.1  MCV 101.0* 100.5*  PLT 240 181   Basic Metabolic Panel:  Recent Labs Lab 04/02/16 1216 04/03/16 0318  NA 140 142  K 4.7 4.1  CL 107 105  CO2 23 25  GLUCOSE 193* 127*  BUN 43* 52*  CREATININE 1.53* 2.12*  CALCIUM 9.9 9.2   GFR: Estimated Creatinine Clearance: 21.7 mL/min (by C-G formula based on SCr of 2.12 mg/dL (H)). Liver Function Tests:  Recent Labs Lab 04/02/16 1216  AST 26  ALT 16  ALKPHOS 69  BILITOT 1.4*  PROT 7.3  ALBUMIN 3.8   No results for input(s): LIPASE, AMYLASE in the last 168 hours. No results for input(s): AMMONIA in the last 168 hours. Coagulation Profile: No results for input(s): INR, PROTIME in the last 168 hours. Cardiac Enzymes: No results for input(s): CKTOTAL, CKMB, CKMBINDEX, TROPONINI in the last 168 hours. BNP (last 3 results) No results for input(s): PROBNP in the last 8760 hours. HbA1C: No results for input(s): HGBA1C in the last 72 hours. CBG:  Recent Labs Lab 04/02/16 1210 04/02/16 2354 04/03/16 0706 04/03/16 0842  GLUCAP 191* 163* 119* 119*   Lipid Profile:  Recent Labs  04/03/16 0318  CHOL 154  HDL 57  LDLCALC 88  TRIG 43  CHOLHDL 2.7   Thyroid Function Tests: No results for input(s): TSH, T4TOTAL, FREET4, T3FREE, THYROIDAB in the last 72 hours. Anemia Panel: No results for input(s): VITAMINB12, FOLATE, FERRITIN, TIBC, IRON, RETICCTPCT in the last 72 hours. Sepsis Labs: No results for input(s): PROCALCITON, LATICACIDVEN in the last 168 hours.  Recent Results (from the past 240 hour(s))  MRSA PCR Screening     Status: None   Collection Time: 04/02/16  7:33 PM  Result Value Ref Range Status   MRSA by PCR NEGATIVE NEGATIVE Final    Comment:        The GeneXpert  MRSA Assay (FDA approved for NASAL specimens only), is one component of a comprehensive MRSA colonization surveillance program. It is not intended to diagnose MRSA infection nor to guide or monitor treatment for MRSA infections.          Radiology Studies: Ct Head Wo Contrast  Result Date: 04/02/2016 CLINICAL DATA:  Altered mental status today. EXAM: CT HEAD WITHOUT CONTRAST TECHNIQUE: Contiguous axial images were obtained from the base of the skull through the vertex without intravenous contrast. COMPARISON:  None. FINDINGS: Brain: Abnormal hypoattenuation is seen in the left frontal and parietal lobes, insular cortex and caudate consistent with acute or subacute left MCA territory infarct. No hemorrhage  is identified. No midline shift or abnormal extra-axial fluid collection. No hydrocephalus or pneumocephalus. Vascular: No hyperdense vessel is seen.  Atherosclerosis noted. Skull: Intact. Sinuses/Orbits: Negative. Other: None. IMPRESSION: Findings consistent with acute or subacute left MCA infarct. No hemorrhage. Atrophy and chronic microvascular ischemic change. These results were called by telephone at the time of interpretation on 04/02/2016 at 3:02 pm to Dr. Lyndal Pulley , who verbally acknowledged these results. Electronically Signed   By: Drusilla Kanner M.D.   On: 04/02/2016 15:02   Dg Chest Port 1 View  Result Date: 04/02/2016 CLINICAL DATA:  Shortness of breath EXAM: PORTABLE CHEST 1 VIEW COMPARISON:  None. FINDINGS: Left chest wall pacer device is noted with lead in the right ventricle. Moderate cardiac enlargement. Diminished lung volumes. Mild diffuse pulmonary edema identified. No airspace opacities noted. IMPRESSION: 1. Cardiac enlargement and mild pulmonary edema. Electronically Signed   By: Signa Kell M.D.   On: 04/02/2016 12:54        Scheduled Meds: .  stroke: mapping our early stages of recovery book   Does not apply Once  . ampicillin-sulbactam (UNASYN) IV  3  g Intravenous Q12H  . aspirin  300 mg Rectal Daily  . enoxaparin (LOVENOX) injection  30 mg Subcutaneous Q24H  . furosemide  40 mg Intravenous Q12H  . insulin aspart  0-9 Units Subcutaneous Q6H   Continuous Infusions:   LOS: 1 day    Time spent: 35 mins     Ry Moody Joline Maxcy, MD [facility] Pager [phone]   If 7PM-7AM, please contact night-coverage [email or web address] Password [email or web address] 04/03/2016, 2:17 PM

## 2016-04-03 NOTE — Progress Notes (Signed)
STROKE TEAM PROGRESS NOTE   HISTORY OF PRESENT ILLNESS (per record) 76 y.o. 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 Despite her multiple health issues she was able to live alone. She ambulated with a walker and was able to perform her own self-care and household chores. Her daughter and family who live close by would check on her daily. The patient's daughter reports that she saw the patient the evening prior to admission at approximately 6 PM. At that time she was doing well and was in her normal state of health. Her daughter returned at about 10:30 this morning and found the patient still in bed and unresponsive. She noted shallow respirations and called 911. Shortly after arrival in the emergency department the patient vomited and possibly aspirated. She was placed on BiPAP. A CT of the head without contrast revealed findings consistent with an acute or subacute left MCA infarct without hemorrhage. The patient is unable to have an MRI secondary to her permanent pacemaker. She has been on aspirin 81 mg daily and according to her daughter has been compliant with her medications. Recently her blood pressure has been difficult to control. Her cardiologist is in [facility].  Date last known well: Date: 04/01/2016 Time last known well: Time: 18:00 tPA Given: No: The patient was out of the window for treatment.   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 Previous echo - mild LV dysfunction - severe pulm htn - mild to mod tricuspid regurgitation. Hx of bilateral carotid disease   SUBJECTIVE (INTERVAL HISTORY) Her daughter and  RN are at the bedside.  Pt has mild respiratory distress. Aphasia, not following commands and right neglect with left gaze preference.   Talked with daughter. She stated that pt lives alone PTA but has multiple medication condition in the past. She has afib on coumadin for years but developed GIB which required blood transfusion, changed to plavix, still GIB and later only on ASA 81mg . She had GIB without triggers, both upper and lower GIB. She follows with cardiology in [place], last TTE was told normal heart function. Pt has previous stroke in 05/2014 with mild right sided residue, especially RUE.    OBJECTIVE Temp:  [97.6 F (36.4 C)-99.5 F (37.5 C)] 98.8 F (37.1 C) (02/25 0400) Pulse Rate:  [78-118] 80 (02/25 0400) Cardiac Rhythm: Ventricular paced (02/25 0400) Resp:  [17-44] 19 (02/25 0400) BP: (101-214)/(54-131) 112/66 (02/25 0400) SpO2:  [88 %-100 %] 100 % (02/25 0400) FiO2 (%):  [60 %-100 %] 60 % (02/24 1736) Weight:  [86.1 kg (189 lb 13.1 oz)-86.7 kg (191 lb 2.2 oz)] 86.1 kg (189 lb 13.1 oz) (02/25 0400)  CBC:  Recent Labs Lab 04/02/16 1216 04/03/16 0318  WBC 11.7* 12.6*  NEUTROABS 10.5* 11.1*  HGB 12.8 12.4  HCT 39.1 40.1  MCV 101.0* 100.5*  PLT 240 181    Basic Metabolic Panel:  Recent Labs Lab 04/02/16 1216 04/03/16 0318  NA 140 142  K 4.7 4.1  CL 107 105  CO2 23 25  GLUCOSE 193* 127*  BUN 43* 52*  CREATININE 1.53* 2.12*  CALCIUM 9.9 9.2    Lipid Panel:    Component Value Date/Time  CHOL 154 04/03/2016 0318   TRIG 43 04/03/2016 0318   HDL 57 04/03/2016 0318   CHOLHDL 2.7 04/03/2016 0318   VLDL 9 04/03/2016 0318   LDLCALC 88 04/03/2016 0318   HgbA1c: No results found for: HGBA1C Urine Drug Screen: No results found for: LABOPIA, COCAINSCRNUR, LABBENZ, AMPHETMU, THCU, LABBARB    IMAGING I have personally reviewed the radiological images below and agree with the radiology interpretations.  Ct Head Wo Contrast 04/02/2016 Findings consistent with  acute or subacute left MCA infarct. No hemorrhage. Atrophy and chronic microvascular ischemic change.   Dg Chest Port 1 View 04/02/2016 Cardiac enlargement and mild pulmonary edema.   CUS - 1-39% ICA stenosis.  Vertebral artery flow is antegrade. Technically difficult study   TTE pending  LE venous doppler pending  TCD pending   PHYSICAL EXAM  Temp:  [97.6 F (36.4 C)-99.5 F (37.5 C)] 97.8 F (36.6 C) (02/25 0843) Pulse Rate:  [78-118] 81 (02/25 0900) Resp:  [17-44] 21 (02/25 0900) BP: (101-214)/(54-131) 132/68 (02/25 0900) SpO2:  [88 %-100 %] 100 % (02/25 0900) FiO2 (%):  [60 %-100 %] 60 % (02/24 1736) Weight:  [189 lb 13.1 oz (86.1 kg)-191 lb 2.2 oz (86.7 kg)] 189 lb 13.1 oz (86.1 kg) (02/25 0400)  General - Well nourished, well developed, in mild respiratory distress.  Ophthalmologic - Fundi not visualized due to noncooperation.  Cardiovascular - Regular rate and rhythm with pacer.  Neuro - awake, eyes open, not quiet alert, mild respiratory distress. Global aphasia, not following commands and no speech output, left gaze preference, right neglect, not blinking to visual threat on the right, PERRL, right facial droop, right palpebral fissure bigger than left. Spontaneously moving on the LUE, with intermittent unsustained tremor, but suppressible. On pain stimulation, LUE against gravity, LLE and RLE mild withdraw, LUE 0/5 with increased muscle tone. Right babinski positive, left babinski negative. DTR increased on the right. Sensation, coordination and gait not tested.    ASSESSMENT/PLAN [name] is a 76 y.o. 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 presenting with  unresponsiveness. She did not receive IV t-PA due to late presentation.  Stroke:  acute left MCA infarct likely embolic secondary to atrial fibrillation not on AC. However, CHF also contributing.   Resultant  Global aphasia, right neglect, left gaze preference, right facial droop and right weakness  MRI - PPM  MRA - PPM  CT - acute or subacute left MCA infarct.  Carotid Doppler -  1-39% ICA stenosis.  Vertebral artery flow is antegrade. Technically difficult study   LE Venous duplex - pending  TCDs - pending  2D Echo - pending  Pacer interrogation - pending  LDL - 88  HgbA1c - pending  VTE prophylaxis - Lovenox  Diet NPO time specified  aspirin 81 mg daily prior to admission, now on No antithrombotic . Started ASA suppositories 300 mg QD  Ongoing aggressive stroke risk factor management  Therapy recommendations: pending  Disposition: Pending  Due to sever neuro deficit and treatment dilemma, recommend palliative care consult for goal of care.  Hx of stroke  05/2014 with right side residue weakness  Daughter said CT showed small infarct  Etiology unclear  Treated in [place]  Continued on ASA  afib not  on AC  Hx of afib treated with coumadin for years  Coumadin stopped due to GIB  Changed to plavix which also stopped due to GIB  On ASA 81mg  PTA, no on ASA suppository  Pace maker interrogation pending  CHF  On lasix  CXR showed cardiac enlargement with CHF  TTE pending  Treatment as per primary team  May consider cardiology consultation if needed  AKI   Cre 1.53->2.12  Likely due to lasix use and restricted IVF with NPO and intermittent low BP  Recommend gentle hydration  Treatment per primary team  Hx of GIB  Both upper and lower GIB  No obvious triggers  Not able to tolerate coumadin or plavix  Required PRBC in the past  On ASA now  Hypertension  Stable  Permissive hypertension (OK if < 180/105) but gradually normalize in  5-7 days  Long-term BP goal normotensive  Hyperlipidemia  Home meds:  Zocor 20 mg daily prior to admission - NPO.  LDL 88, goal < 70  Resume Zocor when PO access is available.  Continue statin at discharge  Other Stroke Risk Factors  Advanced age  ETOH use, will be advised to drink no more than 1 drink per day.  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 I had long discussion with daughter at bedside, recounted HPI and PMH, reviewed images and updated her about pt current condition, treatment options and potential prognosis. Recommended cardiology and palliative care consult. Daughter expressed understanding.   Marvel Plan, MD PhD Stroke Neurology 04/03/2016 10:50 AM   To contact Stroke Continuity provider, please refer to [email or web address]. After hours, contact General Neurology

## 2016-04-03 NOTE — Progress Notes (Signed)
PHARMACY NOTE:  ANTIMICROBIAL RENAL DOSAGE ADJUSTMENT  Current antimicrobial regimen includes a mismatch between antimicrobial dosage and estimated renal function.  As per policy approved by the Pharmacy & Therapeutics and Medical Executive Committees, the antimicrobial dosage will be adjusted accordingly.  Current antimicrobial dosage:  Unasyn 3g IV q8h  Indication: ?aspiration  Renal Function:  Estimated Creatinine Clearance: 21.7 mL/min (by C-G formula based on SCr of 2.12 mg/dL (H)). []      On intermittent HD, scheduled: []      On CRRT    Antimicrobial dosage has been changed to:  Unasyn 3g IV q12h  Additional comments: Will follow renal function and dose adjust as indicated.  Thank you for allowing pharmacy to be a part of this patient's care.  , [facility] 04/03/2016 9:18 AM

## 2016-04-04 ENCOUNTER — Inpatient Hospital Stay (HOSPITAL_COMMUNITY): Payer: Medicare Other

## 2016-04-04 DIAGNOSIS — Z7189 Other specified counseling: Secondary | ICD-10-CM

## 2016-04-04 DIAGNOSIS — I639 Cerebral infarction, unspecified: Secondary | ICD-10-CM

## 2016-04-04 DIAGNOSIS — I63512 Cerebral infarction due to unspecified occlusion or stenosis of left middle cerebral artery: Secondary | ICD-10-CM

## 2016-04-04 DIAGNOSIS — Z515 Encounter for palliative care: Secondary | ICD-10-CM

## 2016-04-04 DIAGNOSIS — G9340 Encephalopathy, unspecified: Secondary | ICD-10-CM

## 2016-04-04 LAB — VAS US CAROTID
LCCADDIAS: 12 cm/s
LEFT VERTEBRAL DIAS: -8 cm/s
LICADSYS: -72 cm/s
LICAPSYS: -60 cm/s
Left CCA dist sys: 70 cm/s
Left CCA prox dias: 12 cm/s
Left CCA prox sys: 73 cm/s
Left ICA dist dias: -18 cm/s
Left ICA prox dias: -15 cm/s
RCCAPSYS: 61 cm/s
RIGHT ECA DIAS: -11 cm/s
RIGHT VERTEBRAL DIAS: 9 cm/s
Right CCA prox dias: 7 cm/s
Right cca dist sys: -65 cm/s

## 2016-04-04 LAB — GLUCOSE, CAPILLARY
GLUCOSE-CAPILLARY: 103 mg/dL — AB (ref 65–99)
GLUCOSE-CAPILLARY: 107 mg/dL — AB (ref 65–99)
GLUCOSE-CAPILLARY: 110 mg/dL — AB (ref 65–99)
Glucose-Capillary: 105 mg/dL — ABNORMAL HIGH (ref 65–99)

## 2016-04-04 LAB — HEMOGLOBIN A1C
Hgb A1c MFr Bld: 5.7 % — ABNORMAL HIGH (ref 4.8–5.6)
Mean Plasma Glucose: 117 mg/dL

## 2016-04-04 MED ORDER — LORAZEPAM 2 MG/ML IJ SOLN: 0.5000 mg | INTRAMUSCULAR | Status: DC | PRN

## 2016-04-04 MED ORDER — SODIUM CHLORIDE 0.9 % IV SOLN
INTRAVENOUS | Status: DC
Start: 2016-04-04 — End: 2016-04-05
  Administered 2016-04-04: 20:00:00 via INTRAVENOUS

## 2016-04-04 MED ORDER — BIOTENE DRY MOUTH MT LIQD: 15.0000 mL | OROMUCOSAL | Status: DC | PRN

## 2016-04-04 MED ORDER — MORPHINE SULFATE (PF) 2 MG/ML IV SOLN
1.0000 mg | INTRAVENOUS | Status: DC | PRN
  Administered 2016-04-04: 1 mg via INTRAVENOUS
  Administered 2016-04-05: 2 mg via INTRAVENOUS
  Filled 2016-04-04 (×2): qty 1

## 2016-04-04 MED ORDER — GLYCOPYRROLATE 0.2 MG/ML IJ SOLN: 0.2000 mg | INTRAMUSCULAR | Status: DC | PRN

## 2016-04-04 MED ORDER — MORPHINE SULFATE (CONCENTRATE) 10 MG/0.5ML PO SOLN: 2.5000 mg | ORAL | Status: DC | PRN

## 2016-04-04 MED ORDER — POLYVINYL ALCOHOL 1.4 % OP SOLN
1.0000 [drp] | Freq: Four times a day (QID) | OPHTHALMIC | Status: DC | PRN
  Filled 2016-04-04: qty 15

## 2016-04-04 NOTE — Clinical Social Work Note (Signed)
At this time CSW has made referral to [facility]. Representative will reach out to daughter and make an apt to meet with pt's daughter in the AM.  Jamey Reas, [place] [phone]

## 2016-04-04 NOTE — Consult Note (Signed)
Consultation Note Date: 04/04/2016   Patient Name: Kristy Morris  DOB: 11/26/1940  MRN: [id number]  Age / Sex: 76 y.o., female  PCP: Jefm Petty, MD Referring Physician: Cherene Altes, MD  Reason for Consultation: Establishing goals of care in setting of severe stroke.   HPI/Patient Profile: 76 y.o. female  with past medical history of HTN, diastolic CHF, CAD, s/p pacemaker, DM type II, CVA April 2016 with minimal residual right sided weakness but able to return to living independently admitted on 04/02/2016 with left MCA stroke.   Clinical Assessment and Goals of Care: I met today with daughter, Santiago Glad, and Karen's son. Santiago Glad is tearful as she tells me about her mother's wishes and Dr. Phoebe Sharps poor prognosis. She confirms that her mother is very independent and would not want artificial feeding and would not be happy with her current QOL or even a QOL a little better than now. She would not want to live in a nursing home.   Santiago Glad understands and agrees with the decision for comfort care given her mother's wishes and poor neurological prognosis. We discussed all aspects of full comfort care and hospice care. Discussed prognosis likely at days to a week or two. All questions/concerns addressed. Emotional support provided.   Primary Decision Maker NEXT OF KIN only child is daughter Santiago Glad (husband has previously died of cancer)    SUMMARY OF RECOMMENDATIONS   - DNR - Comfort care - Transition to hospice facility for EOL  Code Status/Advance Care Planning:  DNR   Symptom Management:   Pain/dyspnea: Morphine 1-2 IV mg every 2 hours prn.   Anxiety: Ativan 0.5 mg IV every 4 hours prn.   Robinul prn secretions.   Palliative Prophylaxis:   Aspiration, Bowel Regimen, Delirium Protocol, Frequent Pain Assessment, Oral Care and Turn Reposition  Additional Recommendations (Limitations, Scope,  Preferences):  Full Comfort Care  Psycho-social/Spiritual:   Desire for further Chaplaincy support:yes  Additional Recommendations: Caregiving  Support/Resources and Education on Hospice  Prognosis:   < 2 weeks  Discharge Planning: Hospice facility      Primary Diagnoses: Present on Admission: . Acute ischemic left MCA stroke ([place]) . Essential hypertension . Leukocytosis . Acute kidney injury ([place]) . Diabetes mellitus type 2 in obese ([place]) . Pacemaker . Respiratory failure ([place])   I have reviewed the medical record, interviewed the patient and family, and examined the patient. The following aspects are pertinent.  Past Medical History:  Diagnosis Date  . CHF (congestive heart failure) ([place])   . Coronary artery disease   . Diabetes mellitus without complication ([place])   . Hypertension   . Leaky heart valve   . Pacemaker   . Stroke [facility])    Social History   Social History  . Marital status: Widowed    Spouse name: N/A  . Number of children: N/A  . Years of education: N/A   Social History Main Topics  . Smoking status: Never Smoker  . Smokeless tobacco: None  . Alcohol use Yes  Comment: weekly  . Drug use: No  . Sexual activity: Not Asked   Other Topics Concern  . None   Social History Narrative  . None   No family history on file. Scheduled Meds: Continuous Infusions: . sodium chloride     PRN Meds:.acetaminophen **OR** acetaminophen (TYLENOL) oral liquid 160 mg/5 mL **OR** acetaminophen, antiseptic oral rinse, glycopyrrolate, hydrALAZINE, ipratropium-albuterol, LORazepam, morphine injection **OR** morphine CONCENTRATE, ondansetron (ZOFRAN) IV, polyvinyl alcohol No Known Allergies Review of Systems  Unable to perform ROS: Acuity of condition    Physical Exam  Constitutional: She appears well-developed. She appears lethargic.  HENT:  Head: Normocephalic and atraumatic.  Cardiovascular: Normal rate.  An irregularly irregular rhythm present.    Pulmonary/Chest: Effort normal. No accessory muscle usage. No tachypnea. No respiratory distress. She has decreased breath sounds. She has rhonchi in the right upper field and the left upper field.  Abdominal: Soft. Normal appearance.  Neurological: She appears lethargic. She is disoriented.  Aphasic, appears to be trying to communicate at times but comes out just a soft moan  Nursing note and vitals reviewed.   Vital Signs: BP (!) 181/85 (BP Location: Left Arm)   Pulse 88   Temp 98 F (36.7 C) (Axillary)   Resp (!) 27   Ht '4\' 10"'$  (1.473 m)   Wt 83.8 kg (184 lb 11.9 oz)   SpO2 99%   BMI 38.61 kg/m  Pain Assessment: PAINAD       SpO2: SpO2: 99 % O2 Device:SpO2: 99 % O2 Flow Rate: .O2 Flow Rate (L/min): 3 L/min  IO: Intake/output summary:  Intake/Output Summary (Last 24 hours) at 04/04/16 1239 Last data filed at 04/04/16 1020  Gross per 24 hour  Intake              650 ml  Output             1250 ml  Net             -600 ml    LBM: Last BM Date: 04/02/16 Baseline Weight: Weight: 86.7 kg (191 lb 2.2 oz) Most recent weight: Weight: 83.8 kg (184 lb 11.9 oz)     Palliative Assessment/Data:   Flowsheet Rows   Flowsheet Row Most Recent Value  Intake Tab  Referral Department  Hospitalist  Unit at Time of Referral  ICU  Palliative Care Primary Diagnosis  Neurology  Date Notified  04/03/16  Palliative Care Type  New Palliative care  Reason for referral  Clarify Goals of Care  Date of Admission  04/02/16  # of days IP prior to Palliative referral  1  Clinical Assessment  Psychosocial & Spiritual Assessment  Palliative Care Outcomes      Time In: 1150 Time Out: 1250 Time Total: 39mn Greater than 50%  of this time was spent counseling and coordinating care related to the above assessment and plan.  Signed by: AVinie Sill NP Palliative Medicine Team Pager # [phone](M-F 8a-5p) Team Phone # [phone](Nights/Weekends)

## 2016-04-04 NOTE — Evaluation (Signed)
Clinical/Bedside Swallow Evaluation Patient Details  Name: Kristy Morris MRN: [id number] Date of Birth: 06/21/1940  Today's Date: 04/04/2016 Time: SLP Start Time (ACUTE ONLY): 1030 SLP Stop Time (ACUTE ONLY): 1040 SLP Time Calculation (min) (ACUTE ONLY): 10 min  Past Medical History:  Past Medical History:  Diagnosis Date  . CHF (congestive heart failure) (HCC)   . Coronary artery disease   . Diabetes mellitus without complication (HCC)   . Hypertension   . Leaky heart valve   . Pacemaker   . Stroke [facility])    Past Surgical History:  Past Surgical History:  Procedure Laterality Date  . ABDOMINAL HYSTERECTOMY    . cardiac stents    . COLONOSCOPY N/A 01/30/2015   Procedure: COLONOSCOPY;  Surgeon: Carman Ching, MD;  Location: [facility] ENDOSCOPY;  Service: Endoscopy;  Laterality: N/A;  . ESOPHAGOGASTRODUODENOSCOPY N/A 01/28/2015   Procedure: ESOPHAGOGASTRODUODENOSCOPY (EGD);  Surgeon: Carman Ching, MD;  Location: [facility] ENDOSCOPY;  Service: Endoscopy;  Laterality: N/A;  . PACEMAKER INSERTION     HPI:  76 y.o.femalewith a history of congestive heart failure, coronary artery disease, previous cardiacstents, permanent pacemaker, valvular heart disease, kidney disease, hypertension, obesity, and a previous stroke in April 2016 with residual right-sided weakness and aphasia. In the Ed, CT head shows CT scan of the brain shows new left MCA stroke.   Assessment / Plan / Recommendation Clinical Impression  Pt demonstrates severe dysphagia primarly due to poor awareness of PO and minimal functional response to trials. Pt upright, gently moaning, eyes barely open and occasionally responsive to family memebers at bedside, sometimes turning her head towards them. No oral movement noted with oral care or ice chip presentation. Pt did have one reflexive swallow in response to moisture in mouth. There is a possibility pt would respond to PO for comfort feeding when more alert, though aspiration risk will  be high. Family seems agreeable to trials of PO for readiness for comfort feeds. Will attempt again tomorrow.  SLP Visit Diagnosis: Dysphagia, oropharyngeal phase (R13.12)    Aspiration Risk  Severe aspiration risk;Risk for inadequate nutrition/hydration    Diet Recommendation Other (Comment) (comfort feeding if alert and aware. )   Medication Administration: Via alternative means    Other  Recommendations Oral Care Recommendations: Oral care QID   Follow up Recommendations 24 hour supervision/assistance      Frequency and Duration min 2x/week  2 weeks       Prognosis Prognosis for Safe Diet Advancement: Guarded Barriers to Reach Goals: Severity of deficits      Swallow Study   General HPI: 76 y.o.femalewith a history of congestive heart failure, coronary artery disease, previous cardiacstents, permanent pacemaker, valvular heart disease, kidney disease, hypertension, obesity, and a previous stroke in April 2016 with residual right-sided weakness and aphasia. In the Ed, CT head shows CT scan of the brain shows new left MCA stroke. Type of Study: Bedside Swallow Evaluation Previous Swallow Assessment: none Diet Prior to this Study: NPO Temperature Spikes Noted: No Respiratory Status: Nasal cannula History of Recent Intubation: No Behavior/Cognition: Lethargic/Drowsy;Doesn't follow directions Oral Cavity Assessment: Dry;Dried secretions Oral Care Completed by SLP: Yes Oral Cavity - Dentition: Dentures, top Self-Feeding Abilities: Total assist Patient Positioning: Upright in bed Baseline Vocal Quality: Low vocal intensity Volitional Cough: Cognitively unable to elicit Volitional Swallow: Unable to elicit    Oral/Motor/Sensory Function Overall Oral Motor/Sensory Function: Severe impairment Facial ROM: Reduced right;Suspected CN VII (facial) dysfunction Facial Symmetry: Abnormal symmetry right;Suspected CN VII (facial) dysfunction Facial Strength: Reduced right;Suspected  CN VII (facial) dysfunction Facial Sensation: Reduced right;Suspected CN V (Trigeminal) dysfunction Lingual ROM: Other (Comment) (minimal lingual movement noted)   Ice Chips Ice chips: Impaired Presentation: Spoon Oral Phase Impairments: Poor awareness of bolus Oral Phase Functional Implications: Oral holding   Thin Liquid Thin Liquid: Not tested    Nectar Thick Nectar Thick Liquid: Not tested   Honey Thick Honey Thick Liquid: Not tested   Puree Puree: Not tested   Solid   GO   Solid: Not tested       Harlon Ditty, MA CCC-SLP [phone]  Calbert Hulsebus, Riley Nearing 04/04/2016,10:56 AM

## 2016-04-04 NOTE — Progress Notes (Signed)
Kristy Morris - Stepdown/ICU TEAM  Kristy Morris  QQP:[id number] DOB: [birth year] DOA: 04/02/2016 PCP: Sid Falcon, MD   Brief Narrative:  76 y.o.femalewith a history of congestive heart failure, coronary artery disease, previous cardiacstents, permanent pacemaker, valvular heart disease, kidney disease, hypertension, obesity, and a previous stroke in April 2016 with residual right-sided weakness who was brought to the emergency room 04/02/16 after being found in her bed unresponsive.  Evaluation in the emergency room noted malignant hypertension with acute hypoxic respiratory failure.  CT of the brain ultimately revealed an acute left MCA distribution stroke.  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 CT scan of the brain shows new left MCA stroke - Felt to be embolic related to atrial fibrillation not on anticoagulation due to history of GI bleeding - unable to get MRI brain due to her pacemaker - Stroke Team has seen in consultation - Patient exhibiting persistent global aphasia, right neglect, left gaze preference, right facial droop, and right sided weakness - Given the serious extent of the patient's significant persisting deficits the family decided to transition to comfort care - the medical team agreed this was most appropriate  Acute respiratory failure secondary to diastolic CHF exacerbation - lasix on hold due to elevated Cr w/ no source of signif intake   Nausea and vomiting - possible aspiration pneumonitis  - Empiric antibiotics of Unasyn utilized for possible aspiration  Accelerated hypertension - to focus on comfort only   Acute kidney injury   S/p Biotronik PM  Diabetes mellitus type 2    DVT prophylaxis: lovenox Code Status: DO NOT RESUSCITATE Family Communication: we have transitioned to comfort focused care   Disposition Plan:  plan to d/c  to hospice facility as soon as arrangements complete    Consultants:  Stroke Team   Subjective: No evidence of distress at time of exam today.  Objective: Vitals:   04/04/16 1000 04/04/16 1019 04/04/16 1157 04/04/16 1200  BP: (!) 169/119 (!) 178/92 (!) 166/81 (!) 181/85  Pulse: 90 84 87 88  Resp: (!) 24 (!) 23 (!) 22 (!) 27  Temp:   98 F (36.7 C)   TempSrc:   Axillary   SpO2: 99% 99% 100% 99%  Weight:      Height:        Intake/Output Summary (Last 24 hours) at 04/04/16 1417 Last data filed at 04/04/16 1020  Gross per 24 hour  Intake              650 ml  Output             1250 ml  Net             -600 ml   Filed Weights   04/02/16 1735 04/03/16 0400 04/04/16 0500  Weight: 86.7 kg (191 lb 2.2 oz) 86.Morris kg (189 lb 13.Morris oz) 83.8 kg (184 lb 11.9 oz)    Examination: General: No acute respiratory distress Lungs: Clear to auscultation bilaterally without wheezes or crackles Cardiovascular: Regular rate and rhythm without murmur gallop or rub normal S1 and S2 Abdomen: Nontender, nondistended, soft, bowel sounds positive, no rebound, no ascites, no appreciable mass Extremities: No significant cyanosis, clubbing, or edema bilateral lower extremities  Data Reviewed:   CBC:  Recent Labs Lab 04/02/16 1216 04/03/16 0318  WBC 11.7* 12.6*  NEUTROABS 10.5* 11.Morris*  HGB 12.8 12.4  HCT 39.Morris 40.Morris  MCV 101.0* 100.5*  PLT 240 181   Basic Metabolic Panel:  Recent Labs Lab 04/02/16 1216 04/03/16 0318  NA 140 142  K 4.7 4.Morris  CL 107 105  CO2 23 25  GLUCOSE 193* 127*  BUN 43* 52*  CREATININE Morris.53* 2.12*  CALCIUM 9.9 9.2   GFR: Estimated Creatinine Clearance: 21.4 mL/min (by C-G formula based on SCr of 2.12 mg/dL (H)).   Liver Function Tests:  Recent Labs Lab 04/02/16 1216  AST 26  ALT 16  ALKPHOS 69  BILITOT Morris.4*  PROT 7.3  ALBUMIN 3.8   HbA1C:  Recent Labs  04/03/16 0318  HGBA1C 5.7*   CBG:  Recent Labs Lab 04/03/16 1220 04/03/16 1824  04/04/16 0003 04/04/16 0603 04/04/16 1156  GLUCAP 107* 130* 103* 110* 105*   Lipid Profile:  Recent Labs  04/03/16 0318  CHOL 154  HDL 57  LDLCALC 88  TRIG 43  CHOLHDL 2.7    Recent Results (from the past 240 hour(s))  MRSA PCR Screening     Status: None   Collection Time: 04/02/16  7:33 PM  Result Value Ref Range Status   MRSA by PCR NEGATIVE NEGATIVE Final    Comment:        The GeneXpert MRSA Assay (FDA approved for NASAL specimens only), is one component of a comprehensive MRSA colonization surveillance program. It is not intended to diagnose MRSA infection nor to guide or monitor treatment for MRSA infections.       LOS: 2 days    [name], MD [facility] Office  [phone] Pager - Text Page per Amion as per below:  On-Call/Text Page:      [email or web address]      password [email or web address]  If 7PM-7AM, please contact night-coverage [email or web address] Password [email or web address] 04/04/2016, 2:29 PM

## 2016-04-04 NOTE — NC FL2 (Signed)
[place] MEDICAID FL2 LEVEL OF CARE SCREENING TOOL     IDENTIFICATION  Patient Name: Kristy Morris Birthdate: 07/18/1940 Sex: female Admission Date (Current Location): 04/02/2016  [facility] and [place] Number:  Producer, television/film/video and Address:  [facility], [address] [place], [place] [address]      Provider Number: [id number]  Attending Physician Name and Address:  [name], MD  Relative Name and Phone Number:       Current Level of Care: Hospital Recommended Level of Care: Skilled Nursing Facility Prior Approval Number:    Date Approved/Denied:   PASRR Number: [id number]  Discharge Plan: SNF    Current Diagnoses: Patient Active Problem List   Diagnosis Date Noted  . Acute ischemic left MCA stroke (HCC) 04/02/2016  . Leukocytosis 04/02/2016  . Acute kidney injury (HCC) 04/02/2016  . Acute exacerbation of CHF (congestive heart failure) (HCC) 04/02/2016  . Diabetes mellitus type 2 in obese (HCC) 04/02/2016  . Pacemaker 04/02/2016  . Respiratory failure (HCC) 04/02/2016  . Acute blood loss anemia 01/28/2015  . Essential hypertension 01/28/2015  . Morbid obesity (HCC) 01/28/2015  . GI bleeding 01/27/2015  . Rectal bleeding     Orientation RESPIRATION BLADDER Height & Weight      (Unable to assess. Pt non verbal)  O2 (Nasal Cannula, 3L) Continent Weight: 184 lb 11.9 oz (83.8 kg) Height:  4\' 10"  (147.3 cm)  BEHAVIORAL SYMPTOMS/MOOD NEUROLOGICAL BOWEL NUTRITION STATUS      Continent  (Please see discharge summary)  AMBULATORY STATUS COMMUNICATION OF NEEDS Skin   Extensive Assist Does not communicate Normal                       Personal Care Assistance Level of Assistance  Total care, Dressing, Feeding, Bathing Bathing Assistance: Maximum assistance Feeding assistance: Maximum assistance Dressing Assistance: Maximum assistance Total Care Assistance: Maximum assistance   Functional Limitations Info  Sight,  Hearing, Speech Sight Info: Adequate Hearing Info: Adequate Speech Info:  (Non verbal)    SPECIAL CARE FACTORS FREQUENCY  OT (By licensed OT), PT (By licensed PT)     PT Frequency: min 4x week OT Frequency: min 4x week            Contractures Contractures Info: Not present    Additional Factors Info  Code Status, Allergies Code Status Info: DNR Allergies Info: No known allergies           Current Medications (04/04/2016):  This is the current hospital active medication list Current Facility-Administered Medications  Medication Dose Route Frequency Provider Last Rate Last Dose  .  stroke: mapping our early stages of recovery book   Does not apply Once 04/06/2016, MD      . acetaminophen (TYLENOL) tablet 650 mg  650 mg Oral Q4H PRN Clydie Braun, MD       Or  . acetaminophen (TYLENOL) solution 650 mg  650 mg Per Tube Q4H PRN Clydie Braun, MD       Or  . acetaminophen (TYLENOL) suppository 650 mg  650 mg Rectal Q4H PRN Clydie Braun, MD   650 mg at 04/04/16 1017  . Ampicillin-Sulbactam (UNASYN) 3 g in sodium chloride 0.9 % 100 mL IVPB  3 g Intravenous Q12H 04/06/16, RPH   3 g at 04/04/16 04/06/16  . aspirin suppository 300 mg  300 mg Rectal Daily David L Rinehuls, PA-C   300 mg at 04/04/16  1017  . enoxaparin (LOVENOX) injection 30 mg  30 mg Subcutaneous Q24H Rondell A Katrinka Blazing, MD   30 mg at 04/03/16 2216  . hydrALAZINE (APRESOLINE) injection 10 mg  10 mg Intravenous Q4H PRN Clydie Braun, MD      . insulin aspart (novoLOG) injection 0-9 Units  0-9 Units Subcutaneous Q6H Clydie Braun, MD   1 Units at 04/03/16 2223  . ipratropium-albuterol (DUONEB) 0.5-2.5 (3) MG/3ML nebulizer solution 3 mL  3 mL Nebulization Q2H PRN Rondell A Katrinka Blazing, MD      . morphine 2 MG/ML injection 1 mg  1 mg Intravenous Q6H PRN Ankit Chirag Amin, MD      . ondansetron (ZOFRAN) injection 4 mg  4 mg Intravenous Q6H PRN Clydie Braun, MD         Discharge Medications: Please see  discharge summary for a list of discharge medications.  Relevant Imaging Results:  Relevant Lab Results:   Additional Information SSN: [id number]  Volney American, LCSW

## 2016-04-04 NOTE — Progress Notes (Signed)
STROKE TEAM PROGRESS NOTE   SUBJECTIVE (INTERVAL HISTORY) Her daughter is at the bedside. Pt still has global aphasia and right hemiplegia. Lying in bed leaning toward bed rail, still has mild respiratory distress. Again discussed with goal of care with daughter and she stated pt does not wish any artifical feeding or PEG tube. I agree with palliative care and daughter is leaning toward to it too. Palliative care team is on board.    OBJECTIVE Temp:  [97.8 F (36.6 C)-99.2 F (37.3 C)] 98 F (36.7 C) (02/26 1157) Pulse Rate:  [79-97] 88 (02/26 1200) Cardiac Rhythm: Ventricular paced (02/26 1200) Resp:  [18-36] 27 (02/26 1200) BP: (144-181)/(75-119) 181/85 (02/26 1200) SpO2:  [95 %-100 %] 99 % (02/26 1200) Weight:  [184 lb 11.9 oz (83.8 kg)] 184 lb 11.9 oz (83.8 kg) (02/26 0500)  CBC:   Recent Labs Lab 04/02/16 1216 04/03/16 0318  WBC 11.7* 12.6*  NEUTROABS 10.5* 11.1*  HGB 12.8 12.4  HCT 39.1 40.1  MCV 101.0* 100.5*  PLT 240 181    Basic Metabolic Panel:   Recent Labs Lab 04/02/16 1216 04/03/16 0318  NA 140 142  K 4.7 4.1  CL 107 105  CO2 23 25  GLUCOSE 193* 127*  BUN 43* 52*  CREATININE 1.53* 2.12*  CALCIUM 9.9 9.2    Lipid Panel:     Component Value Date/Time   CHOL 154 04/03/2016 0318   TRIG 43 04/03/2016 0318   HDL 57 04/03/2016 0318   CHOLHDL 2.7 04/03/2016 0318   VLDL 9 04/03/2016 0318   LDLCALC 88 04/03/2016 0318   HgbA1c:  Lab Results  Component Value Date   HGBA1C 5.7 (H) 04/03/2016   Urine Drug Screen: No results found for: LABOPIA, COCAINSCRNUR, LABBENZ, AMPHETMU, THCU, LABBARB    IMAGING I have personally reviewed the radiological images below and agree with the radiology interpretations.  Ct Head Wo Contrast 04/02/2016 Findings consistent with acute or subacute left MCA infarct. No hemorrhage. Atrophy and chronic microvascular ischemic change.   Dg Chest Port 1 View 04/02/2016 Cardiac enlargement and mild pulmonary edema.   CUS -  1-39% ICA stenosis.  Vertebral artery flow is antegrade. Technically difficult study   TTE Left ventricle: The cavity size was normal. Systolic function was   normal. The estimated ejection fraction was in the range of 60%   to 65%. Images were inadequate for LV wall motion assessment. The   study is not technically sufficient to allow evaluation of LV   diastolic function. - Mitral valve: Calcified annulus. There was mild regurgitation. - Left atrium: The atrium was severely dilated. - Pulmonary arteries: PA peak pressure: 44 mm Hg (S). Impressions: - The right ventricular systolic pressure was increased consistent   with moderate pulmonary hypertension.  LE venous doppler There is no obvious evidence of DVT or SVT noted in the visualized veins of the bilateral lower extremities.    PHYSICAL EXAM  Temp:  [97.8 F (36.6 C)-99.2 F (37.3 C)] 98 F (36.7 C) (02/26 1157) Pulse Rate:  [79-97] 88 (02/26 1200) Resp:  [18-36] 27 (02/26 1200) BP: (144-181)/(75-119) 181/85 (02/26 1200) SpO2:  [95 %-100 %] 99 % (02/26 1200) Weight:  [184 lb 11.9 oz (83.8 kg)] 184 lb 11.9 oz (83.8 kg) (02/26 0500)  General - Well nourished, well developed, in mild respiratory distress.  Ophthalmologic - Fundi not visualized due to noncooperation.  Cardiovascular - Regular rate and rhythm with pacer.  Neuro - awake, eyes open, not quiet alert, mild  respiratory distress. Global aphasia, not following commands and no speech output, left gaze preference, right neglect, not blinking to visual threat on the right, PERRL, right facial droop, right palpebral fissure bigger than left. Spontaneously moving on the LUE, with intermittent unsustained tremor, but suppressible. On pain stimulation, LUE against gravity, LLE and RLE mild withdraw, LUE 0/5 with increased muscle tone. Right babinski positive, left babinski negative. DTR increased on the right. Sensation, coordination and gait not tested.     ASSESSMENT/PLAN [name] is a 76 y.o. 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 presenting with unresponsiveness. She did not receive IV t-PA due to late presentation.  Stroke:  acute left MCA infarct likely embolic secondary to atrial fibrillation not on AC. However, CHF also contributing.   Resultant  Global aphasia, right neglect, left gaze preference, right facial droop and right weakness  MRI - PPM  MRA - PPM  CT - acute or subacute left MCA infarct.  Carotid Doppler -  1-39% ICA stenosis.  Vertebral artery flow is antegrade. Technically difficult study   LE Venous duplex - no DVT  2D Echo - EF 60-65% but pulmonary HTN  Pacer interrogation - pending  LDL - 88  HgbA1c - 5.7  VTE prophylaxis - Lovenox Diet NPO time specified  aspirin 81 mg daily prior to admission, now on No antithrombotic . On ASA suppositories 300 mg QD  Ongoing aggressive stroke risk factor management  Therapy recommendations: pending  Disposition: Pending  Due to sever neuro deficit and treatment dilemma, palliative care on board and will transition to comfort care.  Hx of stroke  05/2014 with right side residue weakness  Daughter said CT showed small infarct  Etiology unclear  Treated in [place]  Continued on ASA  afib not on AC  Hx of afib treated with coumadin for years  Coumadin stopped due to GIB  Changed to plavix which also stopped due to GIB  On ASA 81mg  PTA, now on ASA suppository  CHF  On lasix  CXR showed cardiac enlargement with CHF  TTE EF 60-65% but pulmonary HTN  Agree with comfort care  AKI   Cre 1.53->2.12  Likely due to lasix use and restricted IVF  with NPO and intermittent low BP  Hx of GIB  Both upper and lower GIB  No obvious triggers  Not able to tolerate coumadin or plavix  Required PRBC in the past  On ASA now  Hypertension  Stable  Permissive hypertension (OK if < 180/105) but gradually normalize in 5-7 days  Long-term BP goal normotensive  Hyperlipidemia  Home meds:  Zocor 20 mg daily prior to admission - NPO.  LDL 88, goal < 70  Other Stroke Risk Factors  Advanced age  ETOH use, will be advised to drink no more than 1 drink per day.  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 Please call with questions. Agree with comfort care due to poor  prognosis. Thanks for the consult.   Marvel Plan, MD PhD Stroke Neurology 04/04/2016 3:23 PM   To contact Stroke Continuity provider, please refer to [email or web address]. After hours, contact General Neurology

## 2016-04-04 NOTE — Progress Notes (Signed)
Transcranial Doppler Completed. Sanaz Scarlett, BS, RDMS, RVT  

## 2016-04-04 NOTE — Progress Notes (Signed)
OT Cancellation Note  Patient Details Name: Kristy Morris MRN: [id number] DOB: 05/06/1940   Cancelled Treatment:    Reason Eval/Treat Not Completed: Medical issues which prohibited therapy (Pt refused, not feeling well per RN. Will follow.)  [name] 04/04/2016, 10:41 AM

## 2016-04-04 NOTE — Progress Notes (Signed)
PT Cancellation Note  Patient Details Name: Kristy Morris MRN: [id number] DOB: 02-Sep-1940   Cancelled Treatment:    Reason Eval/Treat Not Completed: Fatigue/lethargy limiting ability to participate (family declining therapies for patient today)   Fabio Asa 04/04/2016, 11:03 AM Charlotte Crumb, PT DPT  [phone]

## 2016-04-04 NOTE — Progress Notes (Signed)
VASCULAR LAB PRELIMINARY  PRELIMINARY  PRELIMINARY  PRELIMINARY  Bilateral lower extremity venous duplex completed.    Preliminary report:  There is no obvious evidence of DVT or SVT noted in the visualized veins of the bilateral lower extremities.   Taj Arteaga, RVT 04/04/2016, 9:18 AM

## 2016-04-05 DIAGNOSIS — R451 Restlessness and agitation: Secondary | ICD-10-CM

## 2016-04-05 DIAGNOSIS — I5021 Acute systolic (congestive) heart failure: Secondary | ICD-10-CM

## 2016-04-05 MED ORDER — POLYVINYL ALCOHOL 1.4 % OP SOLN: 1.0000 [drp] | Freq: Four times a day (QID) | OPHTHALMIC | 0 refills | Status: AC | PRN

## 2016-04-05 MED ORDER — MORPHINE SULFATE (CONCENTRATE) 10 MG/0.5ML PO SOLN: 5.0000 mg | ORAL | Status: DC | PRN

## 2016-04-05 MED ORDER — LORAZEPAM 2 MG/ML IJ SOLN
0.5000 mg | INTRAMUSCULAR | Status: DC | PRN
  Administered 2016-04-05: 1 mg via INTRAVENOUS
  Filled 2016-04-05: qty 1

## 2016-04-05 MED ORDER — MORPHINE SULFATE (PF) 2 MG/ML IV SOLN: 2.0000 mg | INTRAVENOUS | Status: DC | PRN

## 2016-04-05 MED ORDER — IPRATROPIUM-ALBUTEROL 0.5-2.5 (3) MG/3ML IN SOLN: 3.0000 mL | RESPIRATORY_TRACT | 1 refills | Status: AC | PRN

## 2016-04-05 MED ORDER — MORPHINE SULFATE (CONCENTRATE) 10 MG/0.5ML PO SOLN: 5.0000 mg | ORAL | 0 refills | Status: AC | PRN

## 2016-04-05 MED ORDER — LORAZEPAM 2 MG/ML PO CONC: 1.0000 mg | Freq: Three times a day (TID) | ORAL | 0 refills | Status: AC | PRN

## 2016-04-05 NOTE — Progress Notes (Signed)
Patient will DC to: [facility] Anticipated DC date: 04/05/16 Family notified: Daughter Transport by: Sharin Mons   Per MD patient ready for DC to Hospice. RN, patient, patient's family, and facility notified of DC. Discharge Summary sent to facility. RN given number for report. DC packet on chart. Ambulance transport requested for patient.   CSW signing off.  Cristobal Goldmann, [place] Clinical Social Worker [phone]

## 2016-04-05 NOTE — Care Management Note (Signed)
Case Management Note  Patient Details  Name: Kristy Morris MRN: [id number] Date of Birth: 1940/09/21  Subjective/Objective:    Pt was admitted with altered mental status               Action/Plan:  Plan is for patient to discharge to residential hospice - CSW actively working placement   Expected Discharge Date:                  Expected Discharge Plan:  Hospice Medical Facility  In-House Referral:  Clinical Social Work  Discharge planning Services  CM Consult  Post Acute Care Choice:    Choice offered to:     DME Arranged:    DME Agency:     HH Arranged:    HH Agency:     Status of Service:  In process, will continue to follow  If discussed at Long Length of Stay Meetings, dates discussed:    Additional Comments:  Cherylann Parr, RN 04/05/2016, 9:20 AM

## 2016-04-05 NOTE — Discharge Summary (Signed)
Physician Discharge Summary  Kristy Morris MRN: [id number] DOB/AGE: [birth year] 76 y.o.  PCP: Sid Falcon, MD   Admit date: 04/02/2016 Discharge date: 04/05/2016  Discharge Diagnoses:    Principal Problem:   Acute ischemic left MCA stroke [facility]) Active Problems:   Essential hypertension   Leukocytosis   Acute kidney injury (HCC)   Acute congestive heart failure (HCC)   Diabetes mellitus type 2 in obese [facility])   Pacemaker   Respiratory failure (HCC)   Goals of care, counseling/discussion   Palliative care encounter   Acute encephalopathy    Follow-up recommendations Dc to  [facility]       Current Discharge Medication List    START taking these medications   Details  ipratropium-albuterol (DUONEB) 0.5-2.5 (3) MG/3ML SOLN Take 3 mLs by nebulization every 2 (two) hours as needed. Qty: 360 mL, Refills: 1    LORazepam (LORAZEPAM INTENSOL) 2 MG/ML concentrated solution Take 0.5 mLs (1 mg total) by mouth every 8 (eight) hours as needed for anxiety. Qty: 30 mL, Refills: 0    Morphine Sulfate (MORPHINE CONCENTRATE) 10 MG/0.5ML SOLN concentrated solution Take 0.25 mLs (5 mg total) by mouth every 2 (two) hours as needed for moderate pain, severe pain or shortness of breath. Qty: 30 mL, Refills: 0    polyvinyl alcohol (LIQUIFILM TEARS) 1.4 % ophthalmic solution Place 1 drop into both eyes 4 (four) times daily as needed for dry eyes. Qty: 15 mL, Refills: 0      CONTINUE these medications which have NOT CHANGED   Details  Fluticasone-Salmeterol (ADVAIR) 250-50 MCG/DOSE AEPB Inhale 1 puff into the lungs 2 (two) times daily.    nitroGLYCERIN (NITROSTAT) 0.4 MG SL tablet Place 0.4 mg under the tongue every 5 (five) minutes as needed for chest pain.      STOP taking these medications     ALPRAZolam (XANAX) 0.5 MG tablet      amLODipine (NORVASC) 5 MG tablet      Ascorbic Acid (VITAMIN C PO)      aspirin 81 MG tablet      carvedilol (COREG CR) 80 MG 24  hr capsule      chlorthalidone (HYGROTON) 25 MG tablet      cholecalciferol (VITAMIN D) 1000 UNITS tablet      diclofenac sodium (VOLTAREN) 1 % GEL      ferrous sulfate (FERROUSUL) 325 (65 FE) MG tablet      losartan (COZAAR) 100 MG tablet      pantoprazole (PROTONIX) 40 MG tablet      simvastatin (ZOCOR) 20 MG tablet      valsartan (DIOVAN) 320 MG tablet      Wheat Dextrin (BENEFIBER PO)                No Known Allergies    Disposition: hospice    Consults:  PALLIATIVE CARE NEUROLOGY*    Significant Diagnostic Studies:  Ct Head Wo Contrast  Result Date: 04/02/2016 CLINICAL DATA:  Altered mental status today. EXAM: CT HEAD WITHOUT CONTRAST TECHNIQUE: Contiguous axial images were obtained from the base of the skull through the vertex without intravenous contrast. COMPARISON:  None. FINDINGS: Brain: Abnormal hypoattenuation is seen in the left frontal and parietal lobes, insular cortex and caudate consistent with acute or subacute left MCA territory infarct. No hemorrhage is identified. No midline shift or abnormal extra-axial fluid collection. No hydrocephalus or pneumocephalus. Vascular: No hyperdense vessel is seen.  Atherosclerosis noted. Skull: Intact. Sinuses/Orbits: Negative. Other: None. IMPRESSION:  Findings consistent with acute or subacute left MCA infarct. No hemorrhage. Atrophy and chronic microvascular ischemic change. These results were called by telephone at the time of interpretation on 04/02/2016 at 3:02 pm to Dr. Lyndal Pulley , who verbally acknowledged these results. Electronically Signed   By: Drusilla Kanner M.D.   On: 04/02/2016 15:02   Dg Chest Port 1 View  Result Date: 04/02/2016 CLINICAL DATA:  Shortness of breath EXAM: PORTABLE CHEST 1 VIEW COMPARISON:  None. FINDINGS: Left chest wall pacer device is noted with lead in the right ventricle. Moderate cardiac enlargement. Diminished lung volumes. Mild diffuse pulmonary edema identified. No airspace  opacities noted. IMPRESSION: 1. Cardiac enlargement and mild pulmonary edema. Electronically Signed   By: Signa Kell M.D.   On: 04/02/2016 12:54       Filed Weights   04/02/16 1735 04/03/16 0400 04/04/16 0500  Weight: 86.7 kg (191 lb 2.2 oz) 86.1 kg (189 lb 13.1 oz) 83.8 kg (184 lb 11.9 oz)     Microbiology: Recent Results (from the past 240 hour(s))  MRSA PCR Screening     Status: None   Collection Time: 04/02/16  7:33 PM  Result Value Ref Range Status   MRSA by PCR NEGATIVE NEGATIVE Final    Comment:        The GeneXpert MRSA Assay (FDA approved for NASAL specimens only), is one component of a comprehensive MRSA colonization surveillance program. It is not intended to diagnose MRSA infection nor to guide or monitor treatment for MRSA infections.        Blood Culture No results found for: SDES, SPECREQUEST, CULT, REPTSTATUS    Labs: Results for orders placed or performed during the hospital encounter of 04/02/16 (from the past 48 hour(s))  Glucose, capillary     Status: Abnormal   Collection Time: 04/03/16 12:20 PM  Result Value Ref Range   Glucose-Capillary 107 (H) 65 - 99 mg/dL   Comment 1 Notify RN   Glucose, capillary     Status: Abnormal   Collection Time: 04/03/16  6:24 PM  Result Value Ref Range   Glucose-Capillary 130 (H) 65 - 99 mg/dL  Glucose, capillary     Status: Abnormal   Collection Time: 04/04/16 12:03 AM  Result Value Ref Range   Glucose-Capillary 103 (H) 65 - 99 mg/dL  Glucose, capillary     Status: Abnormal   Collection Time: 04/04/16  6:03 AM  Result Value Ref Range   Glucose-Capillary 110 (H) 65 - 99 mg/dL  Glucose, capillary     Status: Abnormal   Collection Time: 04/04/16 11:56 AM  Result Value Ref Range   Glucose-Capillary 105 (H) 65 - 99 mg/dL     Lipid Panel     Component Value Date/Time   CHOL 154 04/03/2016 0318   TRIG 43 04/03/2016 0318   HDL 57 04/03/2016 0318   CHOLHDL 2.7 04/03/2016 0318   VLDL 9 04/03/2016 0318    LDLCALC 88 04/03/2016 0318     Lab Results  Component Value Date   HGBA1C 5.7 (H) 04/03/2016       HPI :    76 y.o. 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 Despite her multiple health issues she was able to live alone. She ambulated with a walker and was able to perform her own self-care and household chores. Her  daughter and family who live close by would check on her daily. The patient's daughter reports that she saw the patient the evening prior to admission at approximately 6 PM. At that time she was doing well and was in her normal state of health. Her daughter returned at about 10:30 this morning and found the patient still in bed and unresponsive. She noted shallow respirations and called 911. Shortly after arrival in the emergency department the patient vomited and possibly aspirated. She was placed on BiPAP. A CT of the head without contrast revealed findings consistent with an acute or subacute left MCA infarct without hemorrhage. The patient is unable to have an MRI secondary to her permanent pacemaker. She has been on aspirin 81 mg daily and according to her daughter has been compliant with her medications. Recently her blood pressure has been difficult to control. Her cardiologist is in [facility].  HOSPITAL COURSE:    Acute Left MCA Stroke with aphasia and right-sided hemiparesis - Patient found unresponsive at home not moving right side. CT scan of the brain shows new left MCA stroke - Felt to be embolic related to atrial fibrillation not on anticoagulation due to history of GI bleeding - unable to get MRI brain due to her pacemaker - Stroke Team has seen in consultation - Patient exhibiting persistent global aphasia, right neglect, left gaze preference, right  facial droop, and right sided weakness - Given the serious extent of the patient's significant persisting deficits the family decided to transition to comfort care - the medical team agreed this was most appropriate  Acute respiratory failure secondary to diastolic CHF exacerbation - lasix on hold due to elevated Cr w/ no source of signif intake   Nausea and vomiting - possible aspiration pneumonitis  - Empiric antibiotics of Unasyn utilized for possible aspiration  Accelerated hypertension - to focus on comfort only   Acute kidney injury   S/p Biotronik PM  Diabetes mellitus type 2   Discharge Exam:   Blood pressure (!) 164/92, pulse 79, temperature 97.6 F (36.4 C), temperature source Oral, resp. rate 18, height 4\' 10"  (1.473 m), weight 83.8 kg (184 lb 11.9 oz), SpO2 95 %.   General: No acute respiratory distress Lungs: Clear to auscultation bilaterally without wheezes or crackles Cardiovascular: Regular rate and rhythm without murmur gallop or rub normal S1 and S2 Abdomen: Nontender, nondistended, soft, bowel sounds positive, no rebound, no ascites, no appreciable mass Extremities: No significant cyanosis, clubbing, or edema bilateral lower extremities      Signed: Caledonia Zou 04/05/2016, 11:53 AM        Time spent >45 mins

## 2016-04-05 NOTE — Progress Notes (Signed)
Daily Progress Note   Patient Name: Kristy Morris       Date: 04/05/2016 DOB: 03/18/1940  Age: 76 y.o. MRN#: [id number] Attending Physician: Richarda Overlie, MD Primary Care Physician: Sid Falcon, MD Admit Date: 04/02/2016  Reason for Consultation/Follow-up: Establishing goals of care in setting of severe stroke.   Subjective: Kristy Morris is restless and agitated.   Length of Stay: 3  Current Medications: Scheduled Meds:    Continuous Infusions: . sodium chloride 10 mL/hr at 04/04/16 2019    PRN Meds: [DISCONTINUED] acetaminophen **OR** [DISCONTINUED] acetaminophen (TYLENOL) oral liquid 160 mg/5 mL **OR** acetaminophen, antiseptic oral rinse, glycopyrrolate, ipratropium-albuterol, LORazepam, morphine injection **OR** morphine CONCENTRATE, ondansetron (ZOFRAN) IV, polyvinyl alcohol  Physical Exam  Constitutional: She appears well-developed. She appears lethargic.  HENT:  Head: Normocephalic and atraumatic.  Cardiovascular: Normal rate.   Pulmonary/Chest: No accessory muscle usage. Tachypnea noted. No respiratory distress.  Abdominal: Soft. Normal appearance.  Neurological: She appears lethargic.  Psychiatric: She is agitated.  Nursing note and vitals reviewed.           Vital Signs: BP (!) 196/105 (BP Location: Left Leg)   Pulse 79   Temp 97.6 F (36.4 C) (Oral)   Resp 18   Ht 4\' 10"  (1.473 m)   Wt 83.8 kg (184 lb 11.9 oz)   SpO2 95%   BMI 38.61 kg/m  SpO2: SpO2: 95 % O2 Device: O2 Device: High Flow Nasal Cannula O2 Flow Rate: O2 Flow Rate (L/min): 3 L/min  Intake/output summary:  Intake/Output Summary (Last 24 hours) at 04/05/16 0934 Last data filed at 04/05/16 0600  Gross per 24 hour  Intake            272.5 ml  Output              700 ml  Net            -427.5 ml   LBM: Last BM Date: 04/02/16 Baseline Weight: Weight: 86.7 kg (191 lb 2.2 oz) Most recent weight: Weight: 83.8 kg (184 lb 11.9 oz)       Palliative Assessment/Data: PPS: 10%    Flowsheet Rows   Flowsheet Row Most Recent Value  Intake Tab  Referral Department  Hospitalist  Unit at Time of Referral  ICU  Palliative Care Primary Diagnosis  Neurology  Date Notified  04/03/16  Palliative Care Type  New Palliative care  Reason for referral  Clarify Goals of Care  Date of Admission  04/02/16  # of days IP prior to Palliative referral  1  Clinical Assessment  Psychosocial & Spiritual Assessment  Palliative Care Outcomes      Patient Active Problem List   Diagnosis Date Noted  . Goals of care, counseling/discussion   . Palliative care encounter   . Acute encephalopathy   . Acute ischemic left MCA stroke (HCC) 04/02/2016  . Leukocytosis 04/02/2016  . Acute kidney injury (HCC) 04/02/2016  . Acute congestive heart failure (HCC) 04/02/2016  . Diabetes mellitus type 2 in obese (HCC) 04/02/2016  . Pacemaker 04/02/2016  . Respiratory failure (HCC) 04/02/2016  . Acute blood loss anemia 01/28/2015  . Essential hypertension 01/28/2015  . Morbid obesity (HCC) 01/28/2015  . GI bleeding 01/27/2015  . Rectal bleeding     Palliative Care Assessment & Plan   HPI: 76 y.o. female  with past medical history of HTN, diastolic CHF, CAD, s/p pacemaker, DM type II, CVA April 2016 with minimal residual right sided weakness but able to return to living independently admitted on 04/02/2016 with left MCA stroke.    Assessment: Daughter, Clydie Braun, is at bedside. She tells me that her mother was naked and pulling at gown, tele leads, and oxygen when she arrived. Unfortunately it seems that Kristy Morris was agitated most of the night per RN report. I am unsure why she did not receive any medications for comfort. Discussed with Audelia Acton, RN today with family need for comfort and medications to  minimize suffering. We are hopeful for transition to hospice so that comfort can be consistently achieved. Emotional support provided to Clydie Braun. Kristy Morris received some morphine and we changed television channel to ocean waves to provide comfort and relaxation.   Recommendations/Plan:  Pain/dyspnea: Morphine 2 mg IV OR 5 mg oral solution every 2 hours prn.   Anxiety: Ativan 0.5-1 mg IV every 2 hours prn.   Robinul prn secretions.   D/C tele and oxygen for comfort.   Goals of Care and Additional Recommendations:  Limitations on Scope of Treatment: Full Comfort Care  Code Status:  DNR  Prognosis:   Hours - Days  Discharge Planning:  Hospice facility   Thank you for allowing the Palliative Medicine Team to assist in the care of this patient.   Total Time Prolonged Time Billed  no       Greater than 50%  of this time was spent counseling and coordinating care related to the above assessment and plan.  Yong Channel, NP Palliative Medicine Team Pager # [phone] (M-F 8a-5p) Team Phone # [phone] (Nights/Weekends)

## 2016-05-08 DEATH — deceased

## 2017-08-16 DIAGNOSIS — R451 Restlessness and agitation: Secondary | ICD-10-CM

## 2017-10-01 IMAGING — CT CT HEAD W/O CM
3 series · 18 of 37 positions shown, 20 images · non-contrast
Comparison: None.

CLINICAL DATA: Altered mental status today.

EXAM:
CT HEAD WITHOUT CONTRAST
TECHNIQUE: Contiguous axial images were obtained from the base of the skull
through the vertex without intravenous contrast.

[Series 201: head w/o, idose (1) · axial · non-contrast · 0.38mm/px · z∈[+97,+222]mm · 8 of 33 slices shown, 10 images]
[im 4/33  brain]
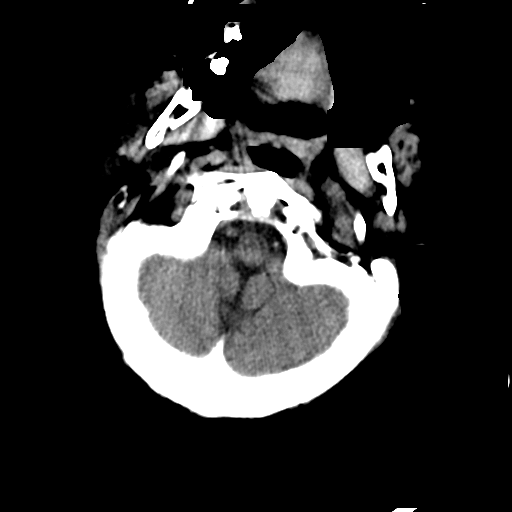
[im 4/33  bone]
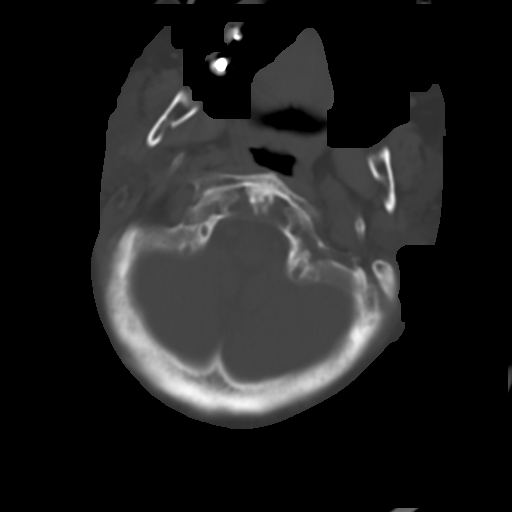
[im 8/33  brain]
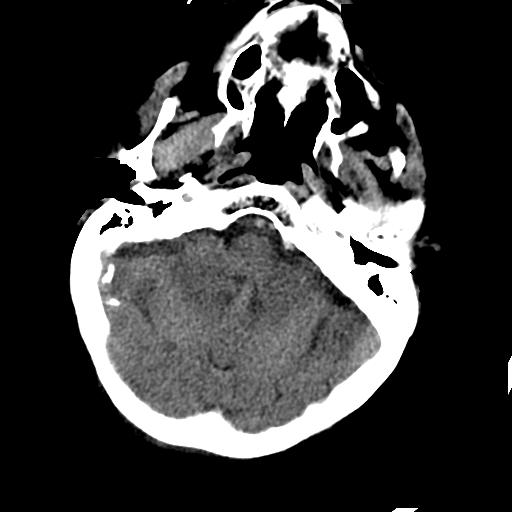
[im 11/33  brain]
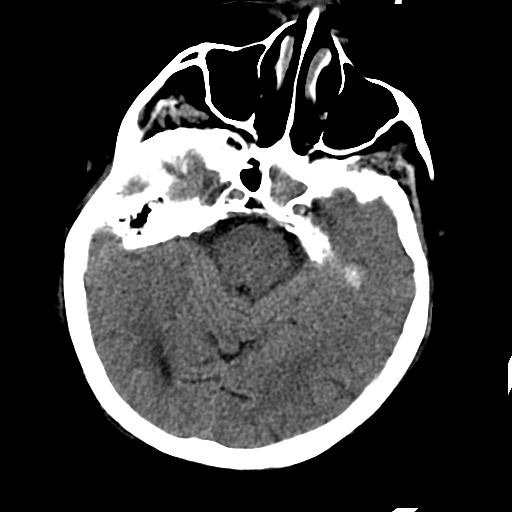
[im 15/33  brain]
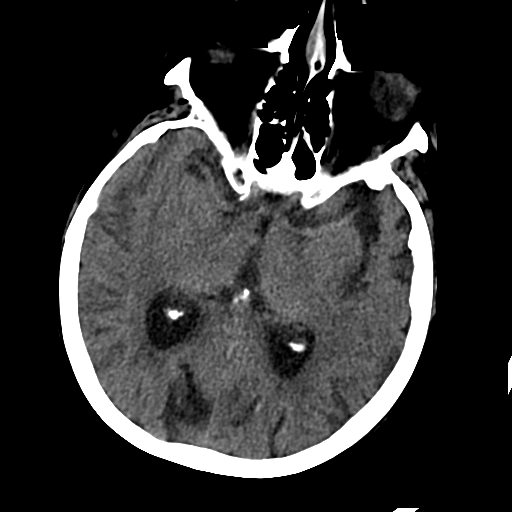
[im 18/33  brain]
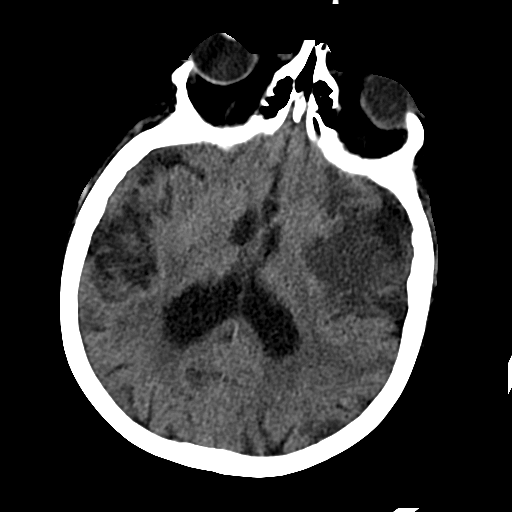
[im 18/33  bone]
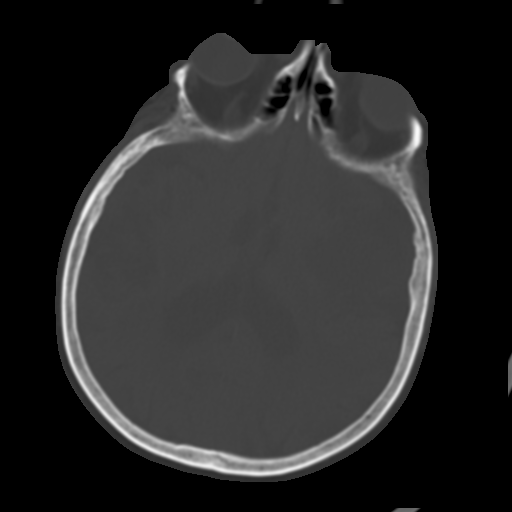
[im 22/33  brain]
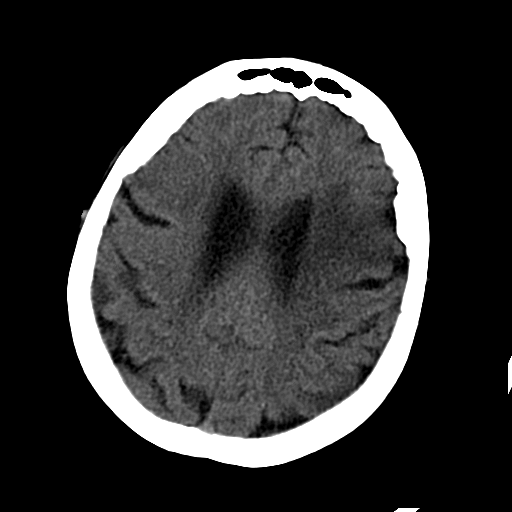
[im 25/33  brain]
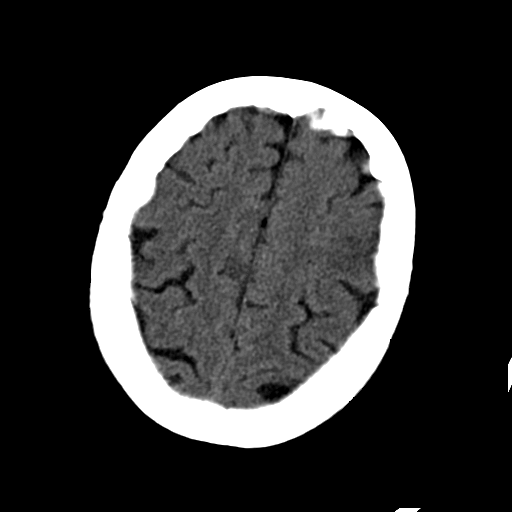
[im 29/33  brain]
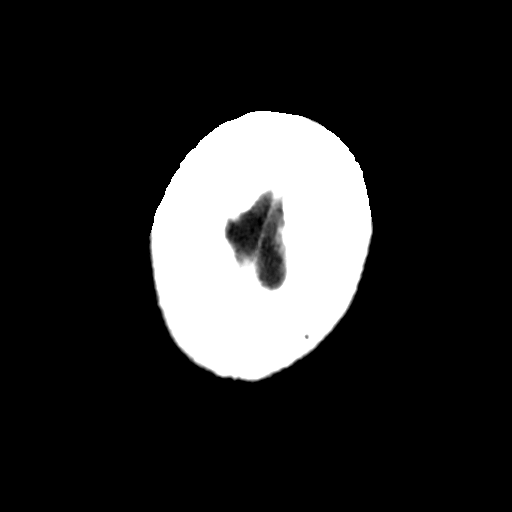

[Series 202: head w/o bone, idose (1) · axial · non-contrast · 0.38mm/px · z∈[+95,+208]mm · 7 of 66 slices shown]
[im 7/66  bone]
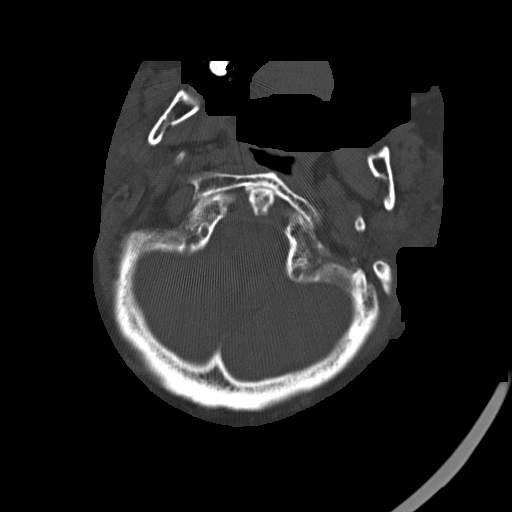
[im 14/66  bone]
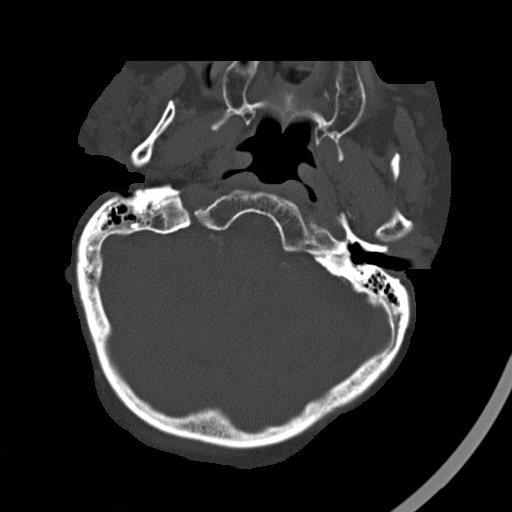
[im 21/66  bone]
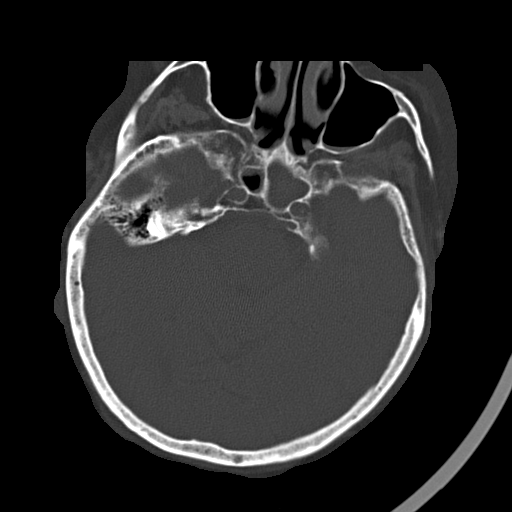
[im 28/66  bone]
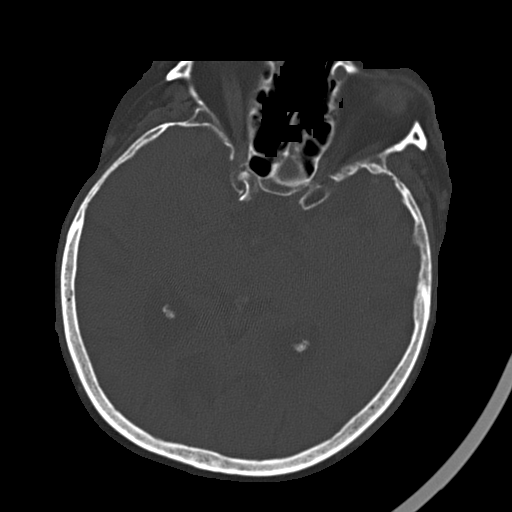
[im 38/66  bone]
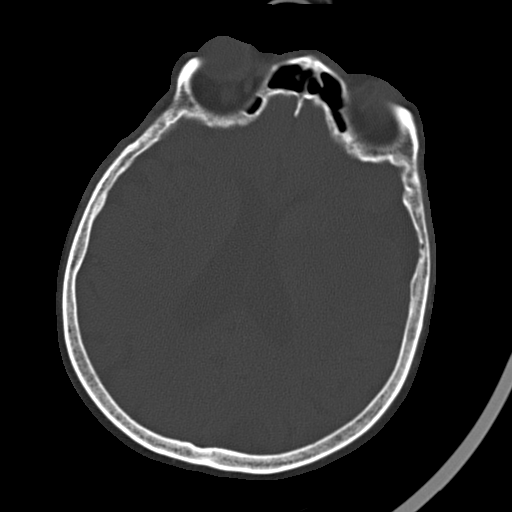
[im 45/66  bone]
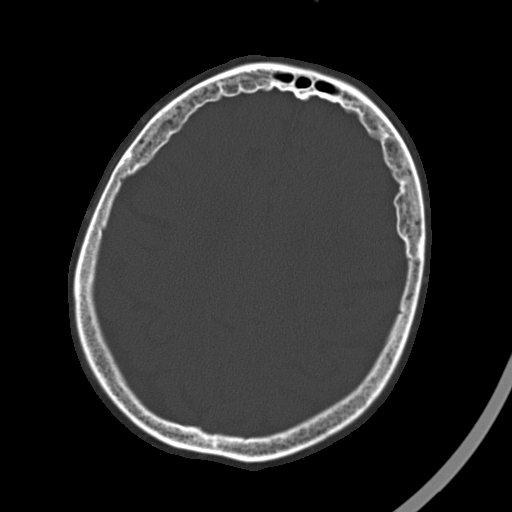
[im 52/66  bone]
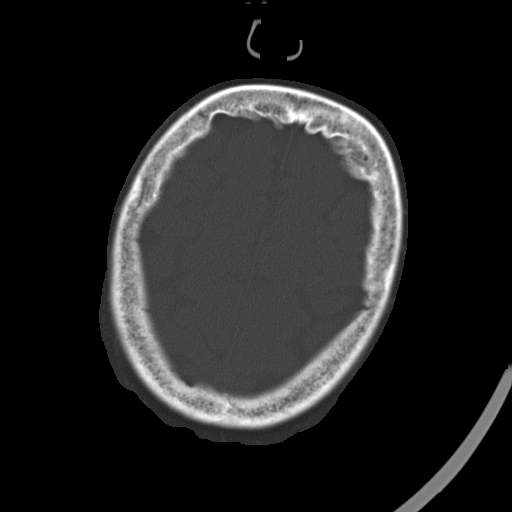

[Series 207: sagittal st, idose (1) · sagittal · 0.47mm/px · 3 of 49 slices shown]
[im 17/49  brain]
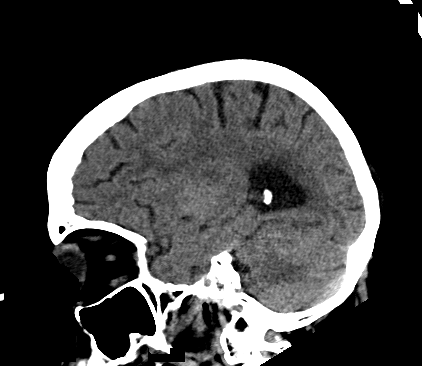
[im 25/49  brain]
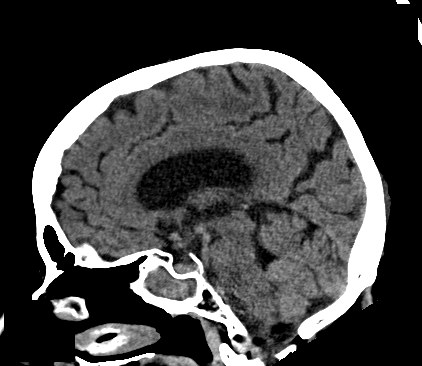
[im 33/49  brain]
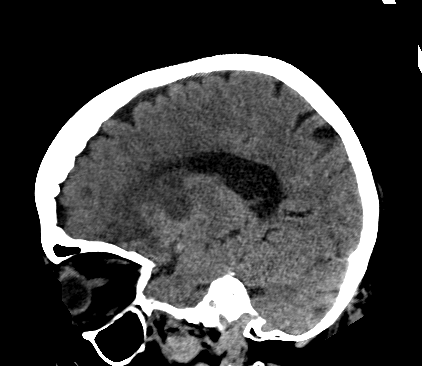

[18 of 37 positions shown; findings below may reference images not displayed]

FINDINGS: Brain: Abnormal hypoattenuation is seen in the left frontal and
parietal lobes, insular cortex and caudate consistent with acute or
subacute left MCA territory infarct. No hemorrhage is identified. No
midline shift or abnormal extra-axial fluid collection. No
hydrocephalus or pneumocephalus.

Vascular: No hyperdense vessel is seen.  Atherosclerosis noted.

Skull: Intact.

Sinuses/Orbits: Negative.

Other: None.
IMPRESSION: Findings consistent with acute or subacute left MCA infarct. No
hemorrhage.

Atrophy and chronic microvascular ischemic change.

These results were called by telephone at the time of interpretation
on 04/02/2016 at [DATE] to Dr. HEYDI BISHOP , who verbally
acknowledged these results.

## 2017-10-01 IMAGING — CR DG CHEST 1V PORT
1 series · 1 of 1 positions shown · non-contrast
Comparison: None.

CLINICAL DATA: Shortness of breath

EXAM:
PORTABLE CHEST 1 VIEW

[AP]
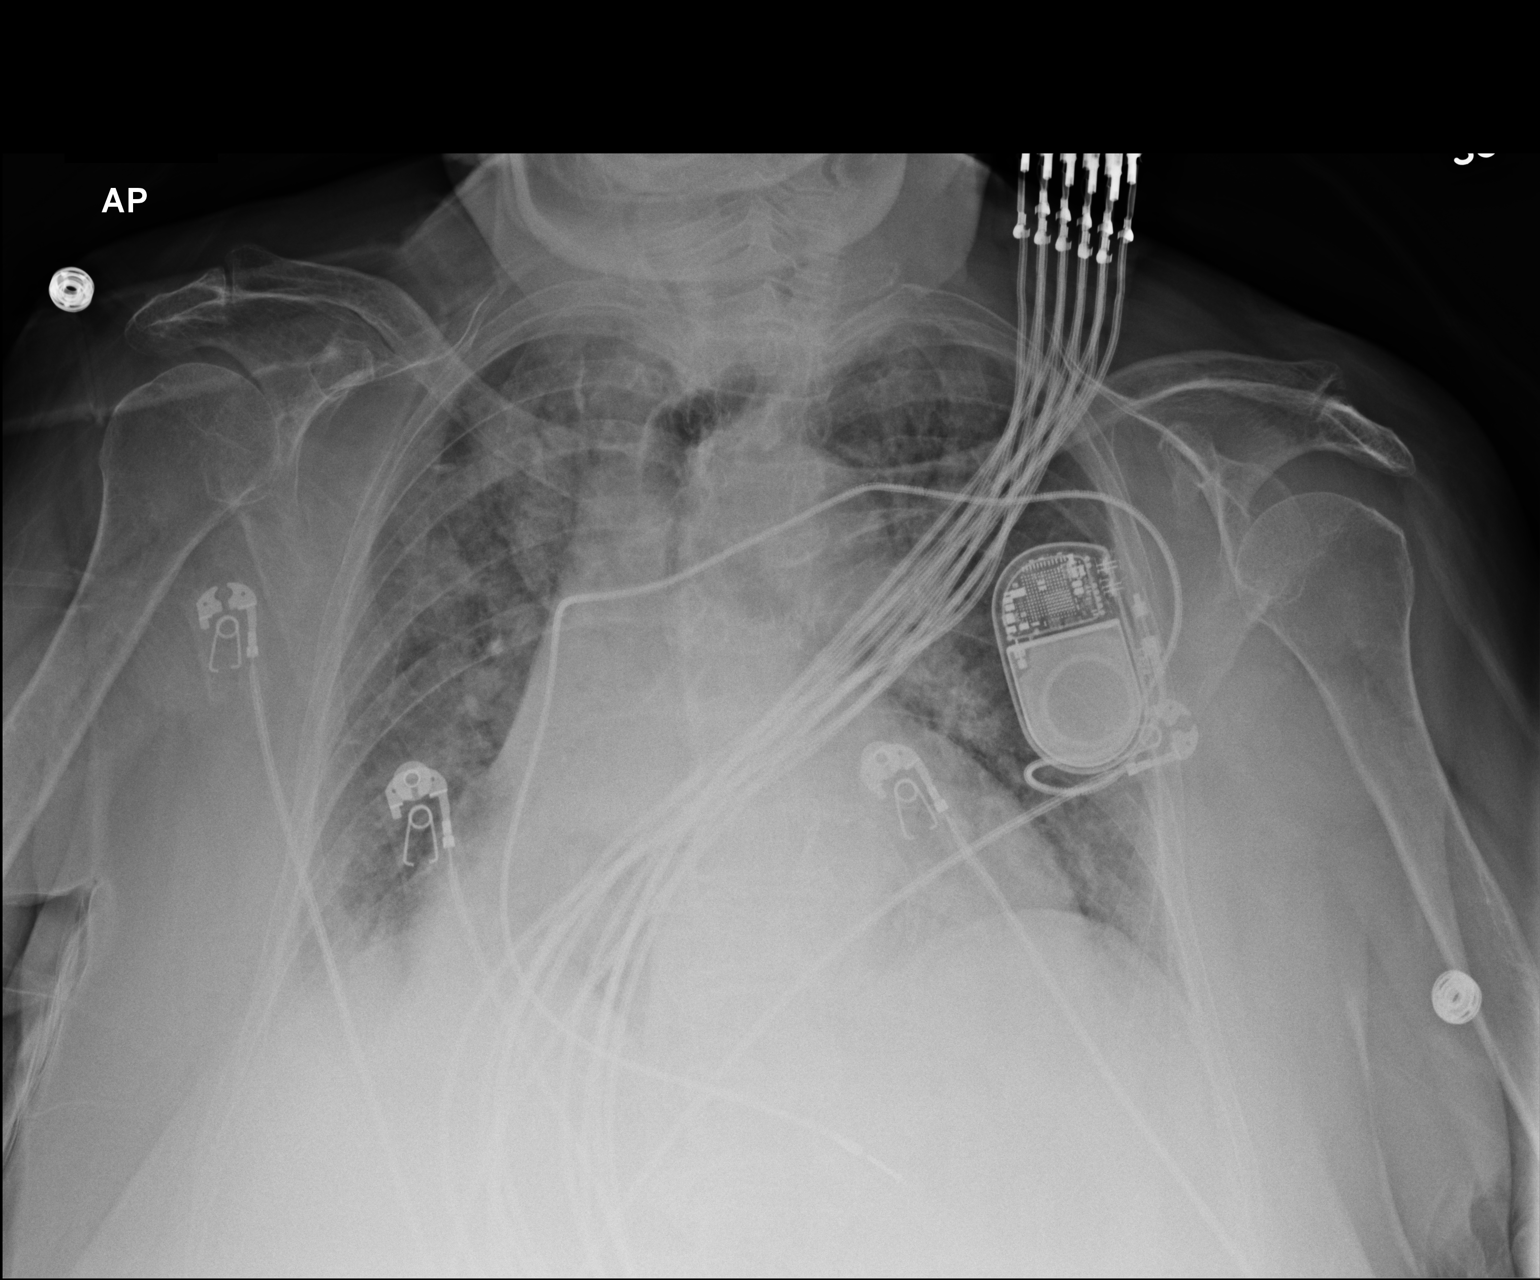

[1 of 1 positions shown; findings below may reference images not displayed]

FINDINGS: Left chest wall pacer device is noted with lead in the right
ventricle. Moderate cardiac enlargement. Diminished lung volumes.
Mild diffuse pulmonary edema identified. No airspace opacities
noted.
IMPRESSION: 1. Cardiac enlargement and mild pulmonary edema.

## 2017-10-20 ENCOUNTER — Other Ambulatory Visit: Payer: Self-pay | Admitting: Internal Medicine
